# Patient Record
Sex: Female | Born: 1935 | Race: White | Hispanic: No | Smoking: Never smoker
Health system: Southern US, Community
[De-identification: ages and names within clinical notes are randomized; demographics above are authoritative.]

## PROBLEM LIST (undated history)

## (undated) DIAGNOSIS — I10 Essential (primary) hypertension: Secondary | ICD-10-CM

## (undated) DIAGNOSIS — R1310 Dysphagia, unspecified: Secondary | ICD-10-CM

## (undated) DIAGNOSIS — K219 Gastro-esophageal reflux disease without esophagitis: Secondary | ICD-10-CM

## (undated) DIAGNOSIS — D649 Anemia, unspecified: Secondary | ICD-10-CM

## (undated) DIAGNOSIS — E785 Hyperlipidemia, unspecified: Secondary | ICD-10-CM

## (undated) DIAGNOSIS — M1990 Unspecified osteoarthritis, unspecified site: Secondary | ICD-10-CM

## (undated) DIAGNOSIS — H348192 Central retinal vein occlusion, unspecified eye, stable: Secondary | ICD-10-CM

## (undated) DIAGNOSIS — F0390 Unspecified dementia without behavioral disturbance: Secondary | ICD-10-CM

## (undated) HISTORY — DX: Hyperlipidemia, unspecified: E78.5

## (undated) HISTORY — DX: Central retinal vein occlusion, unspecified eye, stable: H34.8192

## (undated) HISTORY — DX: Anemia, unspecified: D64.9

## (undated) HISTORY — DX: Gastro-esophageal reflux disease without esophagitis: K21.9

## (undated) HISTORY — DX: Essential (primary) hypertension: I10

## (undated) HISTORY — PX: OTHER SURGICAL HISTORY: SHX169

---

## 1988-06-19 HISTORY — PX: CHOLECYSTECTOMY: SHX55

## 2003-08-22 ENCOUNTER — Emergency Department (HOSPITAL_COMMUNITY): Admission: AD | Admit: 2003-08-22 | Discharge: 2003-08-22 | Payer: Self-pay | Admitting: Family Medicine

## 2004-05-30 ENCOUNTER — Ambulatory Visit: Payer: Self-pay | Admitting: Internal Medicine

## 2004-05-31 ENCOUNTER — Ambulatory Visit: Payer: Self-pay | Admitting: Internal Medicine

## 2004-06-22 ENCOUNTER — Ambulatory Visit: Payer: Self-pay | Admitting: Internal Medicine

## 2004-08-02 ENCOUNTER — Ambulatory Visit: Payer: Self-pay | Admitting: Internal Medicine

## 2005-05-01 ENCOUNTER — Ambulatory Visit: Payer: Self-pay | Admitting: Internal Medicine

## 2005-05-10 ENCOUNTER — Ambulatory Visit: Payer: Self-pay | Admitting: Internal Medicine

## 2005-05-31 ENCOUNTER — Ambulatory Visit: Payer: Self-pay | Admitting: Internal Medicine

## 2005-08-07 ENCOUNTER — Ambulatory Visit: Payer: Self-pay | Admitting: Internal Medicine

## 2005-08-24 ENCOUNTER — Encounter: Admission: RE | Admit: 2005-08-24 | Discharge: 2005-08-24 | Payer: Self-pay | Admitting: Internal Medicine

## 2006-02-12 ENCOUNTER — Ambulatory Visit: Payer: Self-pay | Admitting: Internal Medicine

## 2006-06-08 ENCOUNTER — Ambulatory Visit: Payer: Self-pay | Admitting: Internal Medicine

## 2006-06-08 LAB — CONVERTED CEMR LAB
ALT: 15 units/L (ref 0–40)
Creatinine, Ser: 1 mg/dL (ref 0.4–1.2)
Triglyceride fasting, serum: 91 mg/dL (ref 0–149)

## 2006-06-19 HISTORY — PX: COLONOSCOPY: SHX174

## 2006-07-26 ENCOUNTER — Ambulatory Visit: Payer: Self-pay | Admitting: Internal Medicine

## 2006-07-27 ENCOUNTER — Ambulatory Visit: Payer: Self-pay | Admitting: Internal Medicine

## 2006-07-27 LAB — CONVERTED CEMR LAB
ALT: 11 units/L (ref 0–40)
AST: 19 units/L (ref 0–37)
BUN: 16 mg/dL (ref 6–23)
Basophils Absolute: 0.1 10*3/uL (ref 0.0–0.1)
Creatinine, Ser: 0.9 mg/dL (ref 0.4–1.2)
Hemoglobin: 8.2 g/dL — ABNORMAL LOW (ref 12.0–15.0)
Lymphocytes Relative: 16.4 % (ref 12.0–46.0)
Monocytes Relative: 5.7 % (ref 3.0–11.0)
Platelets: 614 10*3/uL — ABNORMAL HIGH (ref 150–400)
Potassium: 4.2 meq/L (ref 3.5–5.1)
RDW: 15.3 % — ABNORMAL HIGH (ref 11.5–14.6)
Rhuematoid fact SerPl-aCnc: <20 intl units/mL — ABNORMAL LOW (ref 0.0–20.0)
Sed Rate: 97 mm/hr — ABNORMAL HIGH (ref 0–25)
TSH: 1.59 microintl units/mL (ref 0.35–5.50)
Total CK: 70 units/L (ref 7–177)
Uric Acid, Serum: 4.7 mg/dL (ref 2.4–7.0)

## 2006-08-01 ENCOUNTER — Ambulatory Visit: Payer: Self-pay | Admitting: Internal Medicine

## 2006-08-01 ENCOUNTER — Encounter: Admission: RE | Admit: 2006-08-01 | Discharge: 2006-08-01 | Payer: Self-pay | Admitting: Internal Medicine

## 2006-08-02 LAB — CONVERTED CEMR LAB
Basophils Absolute: 0.2 10*3/uL — ABNORMAL HIGH (ref 0.0–0.1)
Basophils Relative: 2.1 % — ABNORMAL HIGH (ref 0.0–1.0)
MCHC: 34 g/dL (ref 30.0–36.0)
Monocytes Absolute: 0.4 10*3/uL (ref 0.2–0.7)
Neutro Abs: 4 10*3/uL (ref 1.4–7.7)
Neutrophils Relative %: 55 % (ref 43.0–77.0)
Platelets: 243 10*3/uL (ref 150–400)
RBC: 4.65 M/uL (ref 3.87–5.11)

## 2006-08-06 ENCOUNTER — Ambulatory Visit: Payer: Self-pay | Admitting: Internal Medicine

## 2006-08-06 LAB — CONVERTED CEMR LAB
Basophils Absolute: 0.1 10*3/uL (ref 0.0–0.1)
Basophils Relative: 0.7 % (ref 0.0–1.0)
Eosinophils Absolute: 0.1 10*3/uL (ref 0.0–0.6)
Eosinophils Relative: 0.9 % (ref 0.0–5.0)
HCT: 23.7 % — CL (ref 36.0–46.0)
RBC: 3.29 M/uL — ABNORMAL LOW (ref 3.87–5.11)
RDW: 15.4 % — ABNORMAL HIGH (ref 11.5–14.6)

## 2006-08-14 ENCOUNTER — Ambulatory Visit: Payer: Self-pay | Admitting: Gastroenterology

## 2006-08-14 LAB — CONVERTED CEMR LAB
Hemoglobin: 6.9 g/dL — CL (ref 12.0–15.0)
IgA: 223 mg/dL (ref 68–378)
Monocytes Absolute: 0.5 10*3/uL (ref 0.2–0.7)
Neutrophils Relative %: 77.8 % — ABNORMAL HIGH (ref 43.0–77.0)
Platelets: 633 10*3/uL — ABNORMAL HIGH (ref 150–400)
RBC: 3.06 M/uL — ABNORMAL LOW (ref 3.87–5.11)
Tissue Transglutaminase Ab, IgA: <3 units (ref <5)

## 2006-08-15 ENCOUNTER — Ambulatory Visit: Payer: Self-pay | Admitting: Internal Medicine

## 2006-08-15 ENCOUNTER — Encounter (HOSPITAL_COMMUNITY): Admission: RE | Admit: 2006-08-15 | Discharge: 2006-08-17 | Payer: Self-pay | Admitting: Gastroenterology

## 2006-08-21 ENCOUNTER — Ambulatory Visit: Payer: Self-pay | Admitting: Gastroenterology

## 2006-08-21 LAB — CONVERTED CEMR LAB
Basophils Relative: 1 % (ref 0.0–1.0)
Eosinophils Absolute: 0.1 10*3/uL (ref 0.0–0.6)
Eosinophils Relative: 1.4 % (ref 0.0–5.0)
Hemoglobin: 10.3 g/dL — ABNORMAL LOW (ref 12.0–15.0)
MCV: 75.4 fL — ABNORMAL LOW (ref 78.0–100.0)
Monocytes Absolute: 0.7 10*3/uL (ref 0.2–0.7)
Neutro Abs: 7.7 10*3/uL (ref 1.4–7.7)
Neutrophils Relative %: 73.2 % (ref 43.0–77.0)
Platelets: 696 10*3/uL — ABNORMAL HIGH (ref 150–400)

## 2006-08-24 ENCOUNTER — Ambulatory Visit: Payer: Self-pay | Admitting: Internal Medicine

## 2006-08-28 ENCOUNTER — Ambulatory Visit: Payer: Self-pay | Admitting: Gastroenterology

## 2006-09-04 ENCOUNTER — Ambulatory Visit: Payer: Self-pay | Admitting: Gastroenterology

## 2006-09-20 ENCOUNTER — Ambulatory Visit: Payer: Self-pay | Admitting: Gastroenterology

## 2006-10-03 ENCOUNTER — Ambulatory Visit: Payer: Self-pay | Admitting: Gastroenterology

## 2006-10-03 ENCOUNTER — Encounter (INDEPENDENT_AMBULATORY_CARE_PROVIDER_SITE_OTHER): Payer: Self-pay | Admitting: Specialist

## 2006-10-11 ENCOUNTER — Ambulatory Visit: Payer: Self-pay | Admitting: Internal Medicine

## 2006-10-23 ENCOUNTER — Ambulatory Visit: Payer: Self-pay | Admitting: Gastroenterology

## 2006-10-23 LAB — CONVERTED CEMR LAB: Tissue Transglutaminase Ab, IgA: <3 units (ref <5)

## 2006-12-03 ENCOUNTER — Ambulatory Visit: Payer: Self-pay | Admitting: Internal Medicine

## 2007-01-09 ENCOUNTER — Ambulatory Visit: Payer: Self-pay | Admitting: Internal Medicine

## 2007-01-09 LAB — CONVERTED CEMR LAB
Direct LDL: 138.1 mg/dL
VLDL: 24 mg/dL (ref 0–40)

## 2007-01-30 ENCOUNTER — Ambulatory Visit: Payer: Self-pay | Admitting: Internal Medicine

## 2007-02-08 ENCOUNTER — Ambulatory Visit: Payer: Self-pay | Admitting: Endocrinology

## 2007-02-11 ENCOUNTER — Emergency Department (HOSPITAL_COMMUNITY): Admission: EM | Admit: 2007-02-11 | Discharge: 2007-02-11 | Payer: Self-pay | Admitting: Emergency Medicine

## 2007-02-15 ENCOUNTER — Ambulatory Visit: Payer: Self-pay | Admitting: Internal Medicine

## 2007-02-22 ENCOUNTER — Encounter: Payer: Self-pay | Admitting: Internal Medicine

## 2007-02-22 ENCOUNTER — Ambulatory Visit (HOSPITAL_COMMUNITY): Admission: RE | Admit: 2007-02-22 | Discharge: 2007-02-22 | Payer: Self-pay | Admitting: Internal Medicine

## 2007-03-20 ENCOUNTER — Ambulatory Visit: Payer: Self-pay | Admitting: Internal Medicine

## 2007-03-27 ENCOUNTER — Ambulatory Visit: Payer: Self-pay | Admitting: Internal Medicine

## 2007-03-27 LAB — CONVERTED CEMR LAB
Eosinophils Absolute: 0 10*3/uL (ref 0.0–0.6)
Eosinophils Relative: 0.2 % (ref 0.0–5.0)
Iron: 26 ug/dL — ABNORMAL LOW (ref 42–145)
MCHC: 33.7 g/dL (ref 30.0–36.0)
MCV: 87.7 fL (ref 78.0–100.0)
Monocytes Absolute: 0.6 10*3/uL (ref 0.2–0.7)
Monocytes Relative: 4.4 % (ref 3.0–11.0)
Neutrophils Relative %: 79 % — ABNORMAL HIGH (ref 43.0–77.0)
Platelets: 491 10*3/uL — ABNORMAL HIGH (ref 150–400)
RBC: 4.09 M/uL (ref 3.87–5.11)
RDW: 16 % — ABNORMAL HIGH (ref 11.5–14.6)

## 2007-04-06 ENCOUNTER — Encounter: Payer: Self-pay | Admitting: Internal Medicine

## 2007-04-06 DIAGNOSIS — M810 Age-related osteoporosis without current pathological fracture: Secondary | ICD-10-CM

## 2007-04-06 DIAGNOSIS — Z87898 Personal history of other specified conditions: Secondary | ICD-10-CM

## 2007-04-06 DIAGNOSIS — I10 Essential (primary) hypertension: Secondary | ICD-10-CM

## 2007-04-06 DIAGNOSIS — E785 Hyperlipidemia, unspecified: Secondary | ICD-10-CM

## 2007-04-06 DIAGNOSIS — K219 Gastro-esophageal reflux disease without esophagitis: Secondary | ICD-10-CM

## 2007-04-15 ENCOUNTER — Encounter: Payer: Self-pay | Admitting: Internal Medicine

## 2007-04-15 ENCOUNTER — Encounter: Admission: RE | Admit: 2007-04-15 | Discharge: 2007-04-15 | Payer: Self-pay | Admitting: Specialist

## 2007-04-17 ENCOUNTER — Ambulatory Visit (HOSPITAL_COMMUNITY): Admission: RE | Admit: 2007-04-17 | Discharge: 2007-04-17 | Payer: Self-pay | Admitting: Radiology

## 2007-04-24 ENCOUNTER — Ambulatory Visit: Payer: Self-pay | Admitting: Internal Medicine

## 2007-04-29 ENCOUNTER — Encounter: Admission: RE | Admit: 2007-04-29 | Discharge: 2007-04-29 | Payer: Self-pay | Admitting: Specialist

## 2007-06-05 ENCOUNTER — Encounter: Payer: Self-pay | Admitting: Internal Medicine

## 2007-06-11 ENCOUNTER — Ambulatory Visit: Payer: Self-pay | Admitting: Internal Medicine

## 2007-06-25 ENCOUNTER — Telehealth: Payer: Self-pay | Admitting: Internal Medicine

## 2007-07-03 ENCOUNTER — Telehealth (INDEPENDENT_AMBULATORY_CARE_PROVIDER_SITE_OTHER): Payer: Self-pay | Admitting: *Deleted

## 2007-07-05 ENCOUNTER — Ambulatory Visit: Payer: Self-pay | Admitting: Internal Medicine

## 2007-07-05 DIAGNOSIS — M5412 Radiculopathy, cervical region: Secondary | ICD-10-CM | POA: Insufficient documentation

## 2007-07-05 DIAGNOSIS — D510 Vitamin B12 deficiency anemia due to intrinsic factor deficiency: Secondary | ICD-10-CM

## 2007-08-07 ENCOUNTER — Ambulatory Visit: Payer: Self-pay | Admitting: Internal Medicine

## 2007-08-12 ENCOUNTER — Encounter (INDEPENDENT_AMBULATORY_CARE_PROVIDER_SITE_OTHER): Payer: Self-pay | Admitting: *Deleted

## 2007-08-18 ENCOUNTER — Encounter: Payer: Self-pay | Admitting: Internal Medicine

## 2007-08-18 DIAGNOSIS — S3210XA Unspecified fracture of sacrum, initial encounter for closed fracture: Secondary | ICD-10-CM | POA: Insufficient documentation

## 2007-08-18 DIAGNOSIS — S343XXA Injury of cauda equina, initial encounter: Secondary | ICD-10-CM

## 2007-08-18 DIAGNOSIS — S322XXA Fracture of coccyx, initial encounter for closed fracture: Secondary | ICD-10-CM

## 2007-08-29 ENCOUNTER — Ambulatory Visit: Payer: Self-pay | Admitting: Internal Medicine

## 2007-09-04 ENCOUNTER — Ambulatory Visit: Payer: Self-pay | Admitting: Internal Medicine

## 2007-09-11 LAB — CONVERTED CEMR LAB
Magnesium: 2.1 mg/dL (ref 1.5–2.5)
Phosphorus: 3.1 mg/dL (ref 2.3–4.6)

## 2007-10-02 ENCOUNTER — Ambulatory Visit: Payer: Self-pay | Admitting: Internal Medicine

## 2007-10-04 ENCOUNTER — Telehealth: Payer: Self-pay | Admitting: Internal Medicine

## 2007-10-11 ENCOUNTER — Telehealth (INDEPENDENT_AMBULATORY_CARE_PROVIDER_SITE_OTHER): Payer: Self-pay | Admitting: *Deleted

## 2007-10-25 ENCOUNTER — Ambulatory Visit (HOSPITAL_COMMUNITY): Admission: RE | Admit: 2007-10-25 | Discharge: 2007-10-25 | Payer: Self-pay | Admitting: Family Medicine

## 2007-11-08 ENCOUNTER — Ambulatory Visit: Payer: Self-pay | Admitting: Internal Medicine

## 2007-11-12 LAB — CONVERTED CEMR LAB: Vit D, 1,25-Dihydroxy: 15 — ABNORMAL LOW (ref 30–89)

## 2007-11-13 ENCOUNTER — Encounter (INDEPENDENT_AMBULATORY_CARE_PROVIDER_SITE_OTHER): Payer: Self-pay | Admitting: *Deleted

## 2007-11-15 ENCOUNTER — Telehealth (INDEPENDENT_AMBULATORY_CARE_PROVIDER_SITE_OTHER): Payer: Self-pay | Admitting: *Deleted

## 2007-12-09 ENCOUNTER — Ambulatory Visit: Payer: Self-pay | Admitting: Internal Medicine

## 2007-12-12 ENCOUNTER — Ambulatory Visit: Payer: Self-pay | Admitting: Internal Medicine

## 2007-12-19 ENCOUNTER — Encounter (INDEPENDENT_AMBULATORY_CARE_PROVIDER_SITE_OTHER): Payer: Self-pay | Admitting: *Deleted

## 2007-12-19 LAB — CONVERTED CEMR LAB
Rhuematoid fact SerPl-aCnc: <20 intl units/mL — ABNORMAL LOW (ref 0.0–20.0)
Sed Rate: 43 mm/hr — ABNORMAL HIGH (ref 0–22)

## 2007-12-30 ENCOUNTER — Ambulatory Visit: Payer: Self-pay | Admitting: Internal Medicine

## 2007-12-30 DIAGNOSIS — M2550 Pain in unspecified joint: Secondary | ICD-10-CM | POA: Insufficient documentation

## 2008-01-02 ENCOUNTER — Ambulatory Visit: Payer: Self-pay | Admitting: Internal Medicine

## 2008-01-02 ENCOUNTER — Telehealth (INDEPENDENT_AMBULATORY_CARE_PROVIDER_SITE_OTHER): Payer: Self-pay | Admitting: *Deleted

## 2008-01-30 ENCOUNTER — Ambulatory Visit: Payer: Self-pay | Admitting: Internal Medicine

## 2008-02-10 LAB — CONVERTED CEMR LAB
AST: 19 units/L (ref 0–37)
Albumin: 3.2 g/dL — ABNORMAL LOW (ref 3.5–5.2)
Alkaline Phosphatase: 45 units/L (ref 39–117)
Basophils Absolute: 0 10*3/uL (ref 0.0–0.1)
Basophils Relative: 0 % (ref 0.0–3.0)
CO2: 31 meq/L (ref 19–32)
HDL: 45.2 mg/dL (ref >39.0)
MCV: 87.2 fL (ref 78.0–100.0)
Monocytes Absolute: 0.6 10*3/uL (ref 0.1–1.0)
Monocytes Relative: 5.3 % (ref 3.0–12.0)
Neutro Abs: 8.7 10*3/uL — ABNORMAL HIGH (ref 1.4–7.7)
Potassium: 4.4 meq/L (ref 3.5–5.1)
Sodium: 139 meq/L (ref 135–145)
TSH: 2.28 microintl units/mL (ref 0.35–5.50)
Total Bilirubin: 0.8 mg/dL (ref 0.3–1.2)
Total CHOL/HDL Ratio: 4
Total Protein: 6.5 g/dL (ref 6.0–8.3)
Triglycerides: 89 mg/dL (ref 0–149)
VLDL: 18 mg/dL (ref 0–40)
Vit D, 1,25-Dihydroxy: 57 (ref 30–89)
Vitamin B-12: 477 pg/mL (ref 211–911)

## 2008-02-26 ENCOUNTER — Ambulatory Visit: Payer: Self-pay | Admitting: Internal Medicine

## 2008-02-26 ENCOUNTER — Encounter (INDEPENDENT_AMBULATORY_CARE_PROVIDER_SITE_OTHER): Payer: Self-pay | Admitting: *Deleted

## 2008-02-26 LAB — CONVERTED CEMR LAB: OCCULT 1: NEGATIVE

## 2008-02-27 ENCOUNTER — Ambulatory Visit: Payer: Self-pay | Admitting: Internal Medicine

## 2008-03-17 ENCOUNTER — Encounter: Payer: Self-pay | Admitting: Internal Medicine

## 2008-03-26 ENCOUNTER — Ambulatory Visit: Payer: Self-pay | Admitting: Internal Medicine

## 2008-03-26 DIAGNOSIS — F329 Major depressive disorder, single episode, unspecified: Secondary | ICD-10-CM

## 2008-07-20 ENCOUNTER — Telehealth (INDEPENDENT_AMBULATORY_CARE_PROVIDER_SITE_OTHER): Payer: Self-pay | Admitting: *Deleted

## 2008-09-15 ENCOUNTER — Ambulatory Visit: Payer: Self-pay | Admitting: Internal Medicine

## 2008-09-20 LAB — CONVERTED CEMR LAB
Lymphocytes Relative: 71.1 % — ABNORMAL HIGH (ref 12.0–46.0)
Monocytes Absolute: 0.5 10*3/uL (ref 0.1–1.0)
Neutro Abs: 0.4 10*3/uL — ABNORMAL LOW (ref 1.4–7.7)
RBC: 3.79 M/uL — ABNORMAL LOW (ref 3.87–5.11)
RDW: 13.4 % (ref 11.5–14.6)
Vitamin B-12: 409 pg/mL (ref 211–911)

## 2008-09-22 ENCOUNTER — Telehealth (INDEPENDENT_AMBULATORY_CARE_PROVIDER_SITE_OTHER): Payer: Self-pay | Admitting: *Deleted

## 2008-09-23 ENCOUNTER — Encounter (INDEPENDENT_AMBULATORY_CARE_PROVIDER_SITE_OTHER): Payer: Self-pay | Admitting: *Deleted

## 2008-10-01 ENCOUNTER — Ambulatory Visit: Payer: Self-pay | Admitting: Internal Medicine

## 2008-10-01 DIAGNOSIS — D7289 Other specified disorders of white blood cells: Secondary | ICD-10-CM

## 2008-10-01 DIAGNOSIS — R350 Frequency of micturition: Secondary | ICD-10-CM | POA: Insufficient documentation

## 2008-10-01 LAB — CONVERTED CEMR LAB
Glucose, Urine, Semiquant: NEGATIVE
Ketones, urine, test strip: NEGATIVE
Protein, U semiquant: NEGATIVE
Urobilinogen, UA: 0.2

## 2008-10-05 ENCOUNTER — Encounter (INDEPENDENT_AMBULATORY_CARE_PROVIDER_SITE_OTHER): Payer: Self-pay | Admitting: *Deleted

## 2008-10-06 ENCOUNTER — Telehealth (INDEPENDENT_AMBULATORY_CARE_PROVIDER_SITE_OTHER): Payer: Self-pay | Admitting: *Deleted

## 2008-10-19 IMAGING — CR DG LUMBAR SPINE COMPLETE 4+V
5 series · 5 of 5 positions shown · non-contrast
Comparison: none

CLINICAL DATA: Back pain.  Leg pain.  Swelling of the legs.  Hypertension.  
 LUMBAR SPINE - 4 VIEW:

[t l-spine a.p.]
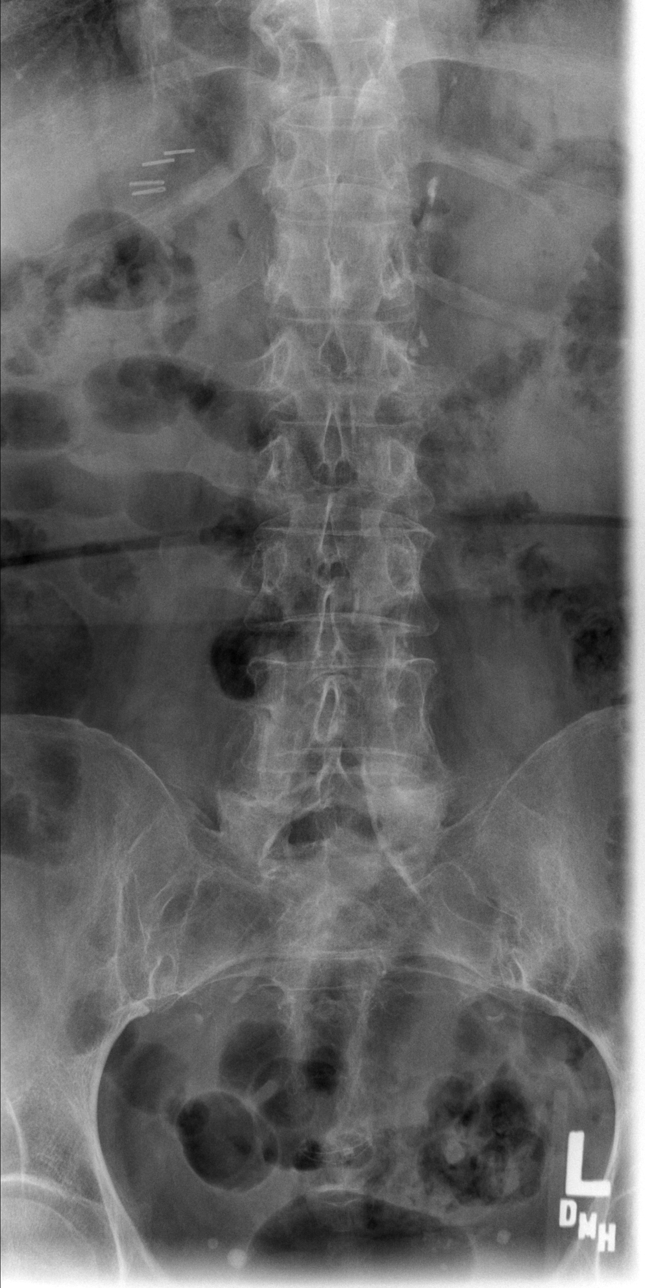

[t l-spine oblique exposure (1 of 2)]
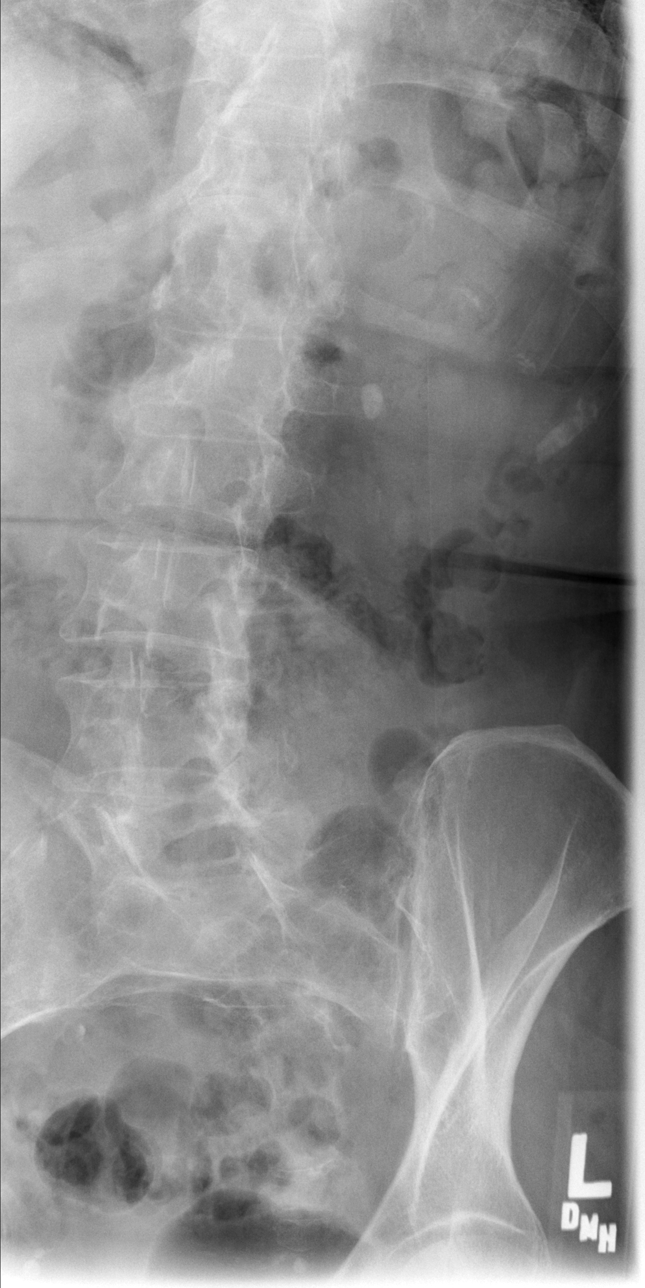

[t l-spine oblique exposure (2 of 2)]
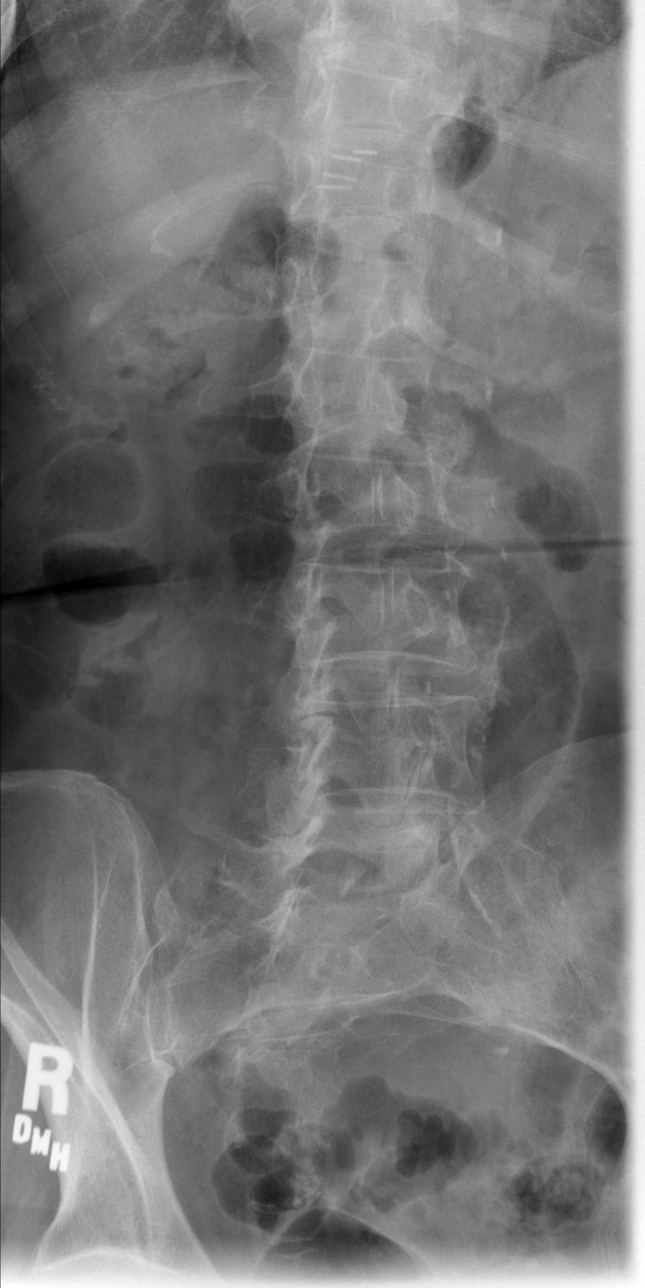

[t l-spine lat]
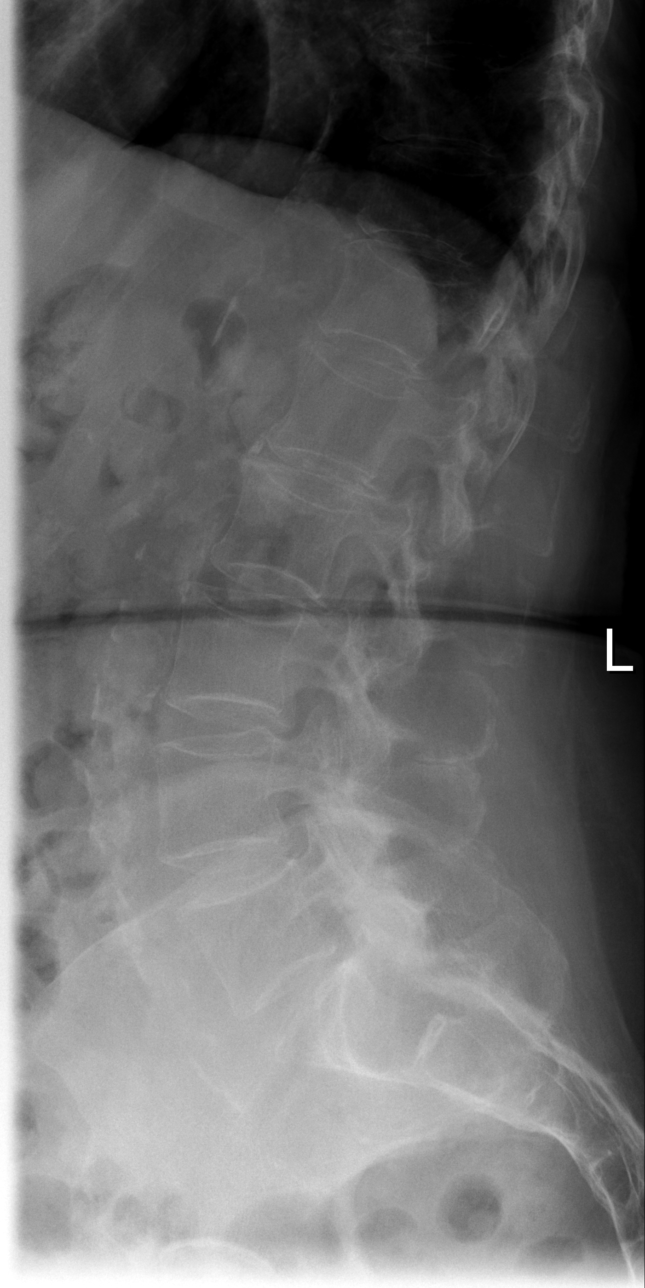

[t l-spine l5-s1 spot]
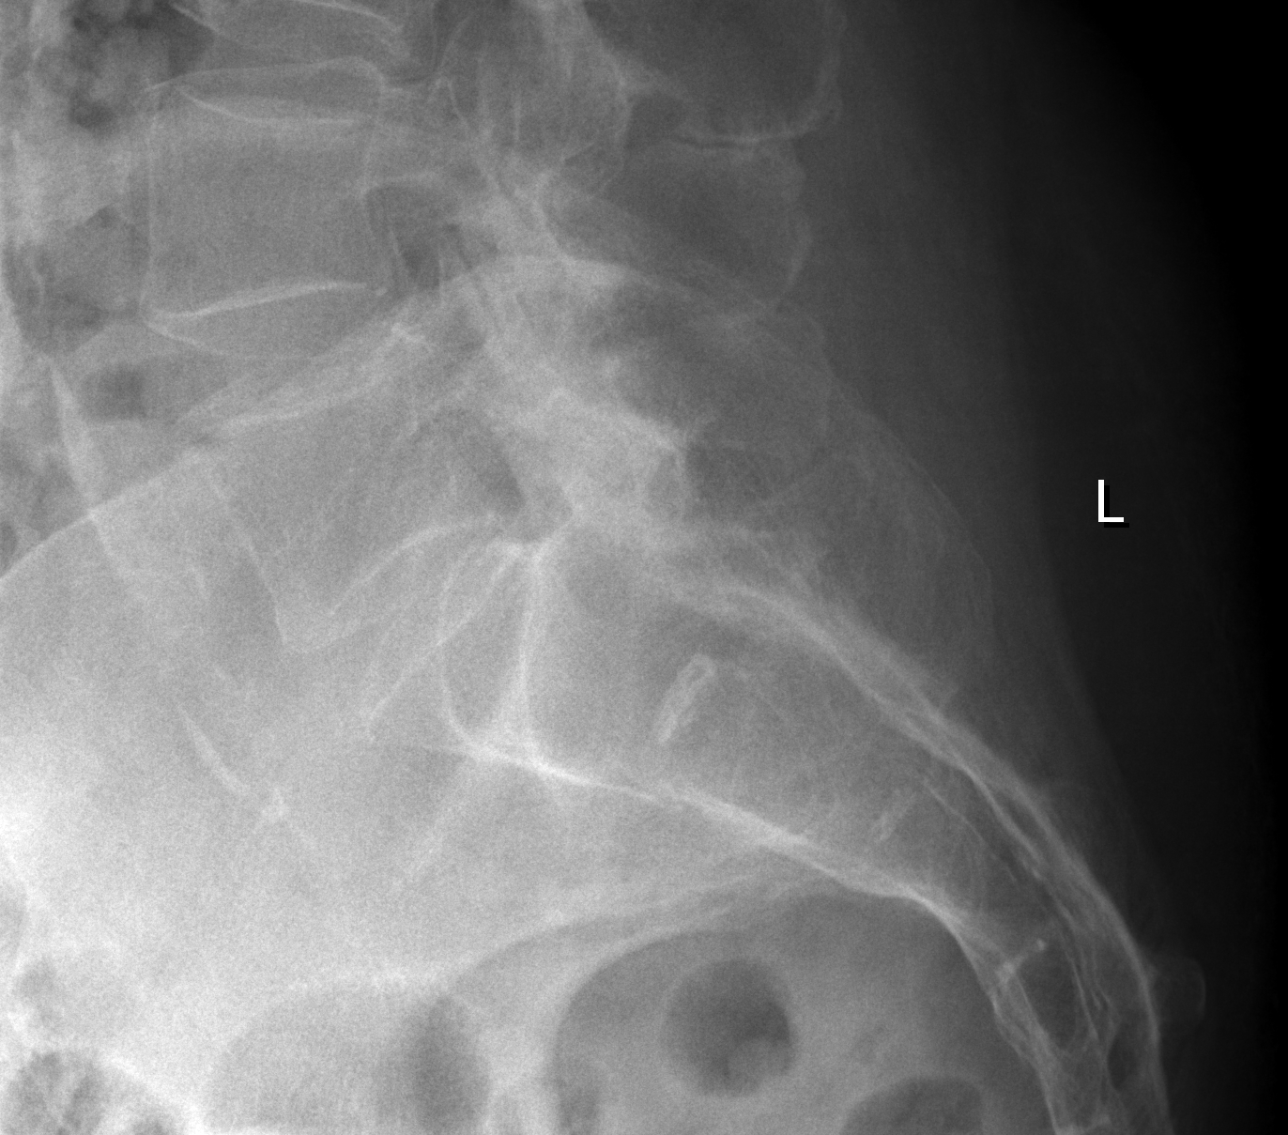

[5 of 5 positions shown; findings below may reference images not displayed]

FINDINGS: Alignment is normal.  No evidence of fracture.  No significant disk space narrowing. Ordinary mild degenerative disease affects the lower lumbar facets.
IMPRESSION: No significant findings.   Mild lower lumbar facet degenerative disease. 
 ACUTE ABDOMINAL SERIES: 
 ABDOMEN - 2 VIEWS:
 Supine and upright views show an unremarkable bowel gas pattern without evidence of ileus, obstruction, or free air.  There is atherosclerosis of the aorta but no discernible aneurysm.  Clips in the right upper quadrant are present post-cholecystectomy.  No focal bony lesion.  
 CHEST - 1 VIEW: 
 Heart size is normal.  There is a large hiatal hernia.   There is mild scarring at the lung bases.  No free air.
IMPRESSION: 1.  Large hiatal hernia.  
 2.  Mild scarring at the lung bases. 
 3.  No abdominal pathology evident.

## 2008-11-27 ENCOUNTER — Telehealth (INDEPENDENT_AMBULATORY_CARE_PROVIDER_SITE_OTHER): Payer: Self-pay | Admitting: *Deleted

## 2008-12-22 ENCOUNTER — Ambulatory Visit: Payer: Self-pay | Admitting: Internal Medicine

## 2008-12-26 LAB — CONVERTED CEMR LAB
HCT: 38.3 % (ref 36.0–46.0)
Hemoglobin: 13.4 g/dL (ref 12.0–15.0)
RBC: 4 M/uL (ref 3.87–5.11)
RDW: 12.2 % (ref 11.5–14.6)
WBC: 4.5 10*3/uL (ref 4.5–10.5)

## 2008-12-29 ENCOUNTER — Encounter (INDEPENDENT_AMBULATORY_CARE_PROVIDER_SITE_OTHER): Payer: Self-pay | Admitting: *Deleted

## 2009-02-01 ENCOUNTER — Ambulatory Visit: Payer: Self-pay | Admitting: Internal Medicine

## 2009-02-01 DIAGNOSIS — R42 Dizziness and giddiness: Secondary | ICD-10-CM | POA: Insufficient documentation

## 2009-02-01 LAB — CONVERTED CEMR LAB: Hemoglobin: 13.5 g/dL

## 2009-06-15 ENCOUNTER — Telehealth (INDEPENDENT_AMBULATORY_CARE_PROVIDER_SITE_OTHER): Payer: Self-pay | Admitting: *Deleted

## 2009-09-15 ENCOUNTER — Telehealth (INDEPENDENT_AMBULATORY_CARE_PROVIDER_SITE_OTHER): Payer: Self-pay | Admitting: *Deleted

## 2009-09-20 ENCOUNTER — Telehealth (INDEPENDENT_AMBULATORY_CARE_PROVIDER_SITE_OTHER): Payer: Self-pay | Admitting: *Deleted

## 2009-11-16 ENCOUNTER — Ambulatory Visit: Payer: Self-pay | Admitting: Internal Medicine

## 2009-11-16 DIAGNOSIS — R5383 Other fatigue: Secondary | ICD-10-CM

## 2009-11-16 DIAGNOSIS — R5381 Other malaise: Secondary | ICD-10-CM | POA: Insufficient documentation

## 2009-11-16 LAB — CONVERTED CEMR LAB
Cholesterol, target level: 200 mg/dL
HDL goal, serum: 40 mg/dL

## 2009-11-23 LAB — CONVERTED CEMR LAB
ALT: 14 units/L (ref 0–35)
AST: 26 units/L (ref 0–37)
Albumin: 4.2 g/dL (ref 3.5–5.2)
Alkaline Phosphatase: 49 units/L (ref 39–117)
Basophils Absolute: 0 10*3/uL (ref 0.0–0.1)
Basophils Relative: 0.2 % (ref 0.0–3.0)
CO2: 32 meq/L (ref 19–32)
Calcium: 9.9 mg/dL (ref 8.4–10.5)
Creatinine, Ser: 1 mg/dL (ref 0.4–1.2)
Eosinophils Absolute: 0.1 10*3/uL (ref 0.0–0.7)
Eosinophils Relative: 1.1 % (ref 0.0–5.0)
GFR calc non Af Amer: 58.98 mL/min (ref >60)
Glucose, Bld: 91 mg/dL (ref 70–99)
HCT: 36.7 % (ref 36.0–46.0)
Hemoglobin: 12.7 g/dL (ref 12.0–15.0)
Lymphocytes Relative: 73.9 % — ABNORMAL HIGH (ref 12.0–46.0)
MCHC: 34.5 g/dL (ref 30.0–36.0)
MCV: 98.8 fL (ref 78.0–100.0)
Neutro Abs: 0.6 10*3/uL — ABNORMAL LOW (ref 1.4–7.7)
Neutrophils Relative %: 12.8 % — ABNORMAL LOW (ref 43.0–77.0)
TSH: 1.46 microintl units/mL (ref 0.35–5.50)
Total CHOL/HDL Ratio: 4
WBC: 4.9 10*3/uL (ref 4.5–10.5)

## 2010-03-18 ENCOUNTER — Telehealth (INDEPENDENT_AMBULATORY_CARE_PROVIDER_SITE_OTHER): Payer: Self-pay | Admitting: *Deleted

## 2010-04-13 ENCOUNTER — Telehealth (INDEPENDENT_AMBULATORY_CARE_PROVIDER_SITE_OTHER): Payer: Self-pay | Admitting: *Deleted

## 2010-04-14 ENCOUNTER — Ambulatory Visit: Payer: Self-pay | Admitting: Internal Medicine

## 2010-04-18 LAB — CONVERTED CEMR LAB
Basophils Absolute: 0.1 10*3/uL (ref 0.0–0.1)
Basophils Relative: 0.9 % (ref 0.0–3.0)
Eosinophils Absolute: 0.1 10*3/uL (ref 0.0–0.7)
Eosinophils Relative: 0.8 % (ref 0.0–5.0)
HCT: 35.6 % — ABNORMAL LOW (ref 36.0–46.0)
Lymphs Abs: 1.9 10*3/uL (ref 0.7–4.0)
Monocytes Absolute: 0.3 10*3/uL (ref 0.1–1.0)
Monocytes Relative: 5.4 % (ref 3.0–12.0)
Neutro Abs: 4.1 10*3/uL (ref 1.4–7.7)
Neutrophils Relative %: 63.5 % (ref 43.0–77.0)
Platelets: 293 10*3/uL (ref 150.0–400.0)

## 2010-04-28 ENCOUNTER — Telehealth: Payer: Self-pay | Admitting: Internal Medicine

## 2010-05-09 ENCOUNTER — Ambulatory Visit: Payer: Self-pay | Admitting: Internal Medicine

## 2010-05-09 DIAGNOSIS — E559 Vitamin D deficiency, unspecified: Secondary | ICD-10-CM | POA: Insufficient documentation

## 2010-05-09 DIAGNOSIS — G2581 Restless legs syndrome: Secondary | ICD-10-CM

## 2010-05-09 DIAGNOSIS — D649 Anemia, unspecified: Secondary | ICD-10-CM

## 2010-05-10 LAB — CONVERTED CEMR LAB
Iron: 113 ug/dL (ref 42–145)
Saturation Ratios: 23.9 % (ref 20.0–50.0)

## 2010-07-19 NOTE — Progress Notes (Signed)
Summary: Refill Request  Phone Note Refill Request Call back at [phone] Message from:  Pharmacy on March 18, 2010 8:16 AM  Refills Requested: Medication #1:  CRESTOR 20 MG TABS take one tablet daily   Dosage confirmed as above?Dosage Confirmed   Supply Requested: 1 month   Last Refilled: 02/17/2009   Notes[facility] [address]  Next Appointment Scheduled: none Initial call taken by: Harold Barban,  March 18, 2010 8:16 AM    Prescriptions: CRESTOR 20 MG TABS (ROSUVASTATIN CALCIUM) take one tablet daily  #30 x 5   Entered by:   Shonna Chock CMA   Authorized by:   Marga Melnick MD   Signed by:   Shonna Chock CMA on 03/18/2010   Method used:   [facility]ically to        [facility] [address] [phone]* (retail)       [address]       [place], [place]  [address]       Ph: [phone] or [phone]       Fax: [phone]   RxID:   [id number]

## 2010-07-19 NOTE — Progress Notes (Signed)
Summary: REFILL  Phone Note Refill Request Message from:  Fax from Pharmacy on September 20, 2009 9:32 AM  Refills Requested: Medication #1:  VITAMIN D 91478 UNIT  CAPS 1 pill weekly [facility] [address] Valinda Hoar [phone]   Method Requested: Fax to Local Pharmacy Next Appointment Scheduled: NO APPT Initial call taken by: Barb Merino,  September 20, 2009 9:33 AM    New/Updated Medications: VITAMIN D 08657 UNIT  CAPS (ERGOCALCIFEROL) 1 pill weekly, **APPOINTMENT DUE** Prescriptions: VITAMIN D 84696 UNIT  CAPS (ERGOCALCIFEROL) 1 pill weekly, **APPOINTMENT DUE**  #4 x 0   Entered by:   Shonna Chock   Authorized by:   Marga Melnick MD   Signed by:   Shonna Chock on 09/20/2009   Method used:   Electronically to        [facility] [address] [phone]* (retail)       [address]       [place], [place]  [address]       Ph: [phone] or [phone]       Fax: [phone]   RxID:   [id number]

## 2010-07-19 NOTE — Progress Notes (Signed)
Summary: ? about lab  Phone Note Call from Patient Call back at [phone] daughter   Summary of Call: patient daughter Aggie Cosier - wanted to know if patient was to have lab drawn - per note on lab [phone] patient was to have  cbcd in 6-8 weeks - since patient didnt repeat lab should she have lab now .Marland KitchenOkey Regal Spring  April 13, 2010 2:43 PM   Follow-up for Phone Call        yes. Lucious Groves CMA  April 13, 2010 3:08 PM   lab scheculed at [facility] [phone] .Marland KitchenOkey Regal Spring  April 13, 2010 4:17 PM

## 2010-07-19 NOTE — Progress Notes (Signed)
Summary: Refill Request  Phone Note Refill Request Message from:  [facility] [address] Valinda Hoar #: [phone]  Refills Requested: Medication #1:  VITAMIN D 69629 UNIT  CAPS 1 pill weekly   Dosage confirmed as above?Dosage Confirmed   Supply Requested: 3 months   Last Refilled: 02/17/2009 Next Appointment Scheduled: none Initial call taken by: Harold Barban,  September 15, 2009 8:38 AM    New/Updated Medications: VITAMIN D 52841 UNIT  CAPS (ERGOCALCIFEROL) 1 pill weekly, APPOINTMENT DUE Prescriptions: VITAMIN D 32440 UNIT  CAPS (ERGOCALCIFEROL) 1 pill weekly, APPOINTMENT DUE  #12 x 0   Entered by:   Shonna Chock   Authorized by:   Marga Melnick MD   Signed by:   Shonna Chock on 09/15/2009   Method used:   Electronically to        [facility] [address] [phone]* (retail)       [address]       [place], [place]  [address]       Ph: [phone] or [phone]       Fax: [phone]   RxID:   [id number]

## 2010-07-19 NOTE — Progress Notes (Signed)
Summary: Vitamin D refill  Phone Note Call from Patient   Summary of Call: Patient called the office for Vitamin D refill, she is aware that she needs an appt. I told the patient I would provide one refill of the med,but her daughter needs to call and schedule patient appt.  FYI--The patient cannot get here without her daughter and I could not make appt due to pt did not know her daughters DOB or appt date for me to look her up. Her daughter works for the school and can only talk at certain times, so I made the patient aware to have her daughter contact us to schedule appt. Initial call taken by: Lucious Groves CMA,  April 28, 2010 10:31 AM    Prescriptions: VITAMIN D 16109 UNIT  CAPS (ERGOCALCIFEROL) 1 pill weekly, **APPOINTMENT DUE**  #4 x 0   Entered by:   Lucious Groves CMA   Authorized by:   Marga Melnick MD   Signed by:   Lucious Groves CMA on 04/28/2010   Method used:   Electronically to        [facility] [address] [phone]* (retail)       [address]       [place], [place]  [address]       Ph: [phone] or [phone]       Fax: [phone]   RxID:   [id number]

## 2010-07-19 NOTE — Assessment & Plan Note (Signed)
Summary: ov per md/cbs   Vital Signs:  Patient profile:   77 year old female Weight:      128 pounds BMI:     22.76 Temp:     98.7 degrees F oral Pulse rate:   88 / minute Resp:     15 per minute BP sitting:   118 / 70  (left arm) Cuff size:   large  Vitals Entered By: Shonna Chock CMA (May 09, 2010 10:40 AM) CC: Renew tramadol and vit d rx's    CC:  Renew tramadol and vit d rx's .  History of Present Illness: She is taking Tramadol at bedtime to help Restless Leg Symptoms. Vitamin D level was 65 in 05/11; she remains on 50,000 International Units weekly. Mild anemia documented in 03/2010.  Current Medications (verified): 1)  Benazepril Hcl 10 Mg Tabs (Benazepril Hcl) .Marland Kitchen.. 1 Once Daily 2)  Crestor 20 Mg Tabs (Rosuvastatin Calcium) .... Take One Tablet Daily 3)  Tums 500 Mg  Chew (Calcium Carbonate Antacid) .... By Mouth As Needed 4)  Aleve 220 Mg  Tabs (Naproxen Sodium) .... Two Times A Day By Mouth As Needed 5)  Baby Asa 81mg  6)  Vitamin D 16109 Unit  Caps (Ergocalciferol) .Marland Kitchen.. 1 Pill Weekly, **appointment Due** 7)  Tramadol Hcl 50 Mg  Tabs (Tramadol Hcl) .Marland Kitchen.. 1 -2 Q 6 Hrs As Needed Joint Pain 8)  Amitriptyline Hcl 10 Mg Tabs (Amitriptyline Hcl) .Marland Kitchen.. 1 At Bedtime 9)  Prilosec Otc 20 Mg Tbec (Omeprazole Magnesium) .Marland Kitchen.. 1 By Mouth Once Daily As Needed  Allergies: 1)  ! Cipro  Review of Systems GI:  Denies abdominal pain, bloody stools, and dark tarry stools. Heme:  Denies abnormal bruising and bleeding.  Physical Exam  General:  Appears younger than age,in no acute distress; alert,appropriate and cooperative throughout examination Lungs:  Normal respiratory effort, chest expands symmetrically. Lungs are clear to auscultation, no crackles or wheezes. Heart:  Normal rate and regular rhythm. S1 and S2 normal without gallop, murmur, click, rub . S4 with slurring Abdomen:  Bowel sounds positive,abdomen soft and non-tender without masses, organomegaly or hernias  noted. Pulses:  R and L dorsalis pedis and posterior tibial pulses are full and equal bilaterally Extremities:  No clubbing, cyanosis, edema. Cervical Nodes:  No lymphadenopathy noted Axillary Nodes:  No palpable lymphadenopathy   Impression & Recommendations:  Problem # 1:  RESTLESS LEG SYNDROME (ICD-333.94)  Orders: Venipuncture (60454) TLB-IBC Pnl (Iron/FE;Transferrin) (83550-IBC) Prescription Created Electronically [phone]  Problem # 2:  ANEMIA, MILD (ICD-285.9)  Orders: Venipuncture (91478) TLB-B12 + Folate Pnl (29562_13086-V78/ION) TLB-IBC Pnl (Iron/FE;Transferrin) (83550-IBC)  Problem # 3:  VITAMIN D DEFICIENCY (ICD-268.9)  Orders: T-Vitamin D (25-Hydroxy) (62952-84132)  Complete Medication List: 1)  Benazepril Hcl 10 Mg Tabs (benazepril Hcl)  .Marland Kitchen.. 1 once daily 2)  Crestor 20 Mg Tabs (Rosuvastatin calcium) .... Take one tablet daily 3)  Tums 500 Mg Chew (Calcium carbonate antacid) .... By mouth as needed 4)  Aleve 220 Mg Tabs (Naproxen sodium) .... Two times a day by mouth as needed 5)  Baby Asa 81mg   6)  Vitamin D 44010 Unit Caps (Ergocalciferol) .Marland Kitchen.. 1 pill weekly 7)  Tramadol Hcl 50 Mg Tabs (Tramadol hcl) .Marland Kitchen.. 1 -2 q 6 hrs as needed joint pain 8)  Amitriptyline Hcl 10 Mg Tabs (Amitriptyline hcl) .Marland Kitchen.. 1 at bedtime 9)  Prilosec Otc 20 Mg Tbec (Omeprazole magnesium) .Marland Kitchen.. 1 by mouth once daily as needed  Patient Instructions: 1)  Vitamin D dose  will be determined based on results today. 2)  Please schedule a follow-up appointment in 6 months. 3)  BMP prior to visit, ICD-9:401.9 4)  Hepatic Panel prior to visit, ICD-9:995.20 5)  Lipid Panel prior to visit, ICD-9:272.4 6)  TSH prior to visit, ICD-9:272.4 7)  CBC w/ Diff prior to visit, ICD-9:285.9 Prescriptions: TRAMADOL HCL 50 MG  TABS (TRAMADOL HCL) 1 -2 q 6 hrs as needed joint pain  #90 x 1   Entered and Authorized by:   Marga Melnick MD   Signed by:   Marga Melnick MD on 05/09/2010   Method used:   Faxed  to ...       [facility] [address] [phone]* (retail)       [address]       [place], [place]  [address]       Ph: [phone] or [phone]       Fax: [phone]   RxID:   [id number]    Orders Added: 1)  Est. Patient Level III [16967] 2)  Venipuncture [89381] 3)  TLB-B12 + Folate Pnl [82746_82607-B12/FOL] 4)  TLB-IBC Pnl (Iron/FE;Transferrin) [83550-IBC] 5)  T-Vitamin D (25-Hydroxy) [01751-02585] 6)  Prescription Created Electronically [phone]  Appended Document: ov per md/cbs

## 2010-07-19 NOTE — Assessment & Plan Note (Signed)
Summary: med refill /cbs   Vital Signs:  Patient profile:   77 year old female Height:      63 inches Weight:      127 pounds BMI:     22.58 Temp:     98.2 degrees F oral Pulse rate:   60 / minute Resp:     14 per minute BP sitting:   114 / 68  (left arm) Cuff size:   large  Vitals Entered By: Kara Burnett (Nov 16, 2009 2:22 PM) CC: Yearly follow-up on meds , Hypertension Management, Lipid Management Comments REVIEWED MED LIST, PATIENT AGREED DOSE AND INSTRUCTION CORRECT    CC:  Yearly follow-up on meds , Hypertension Management, and Lipid Management.  History of Present Illness: Kara Burnett is here for med refills; she has no active concern except intermittent weakness. No regular CVE, some yardwork. No specific diet.  Hypertension History:      She denies headache, chest pain, palpitations, dyspnea with exertion, orthopnea, PND, peripheral edema, visual symptoms, neurologic problems, syncope, and side effects from treatment.  BP well controlled @ home .        Positive major cardiovascular risk factors include female age 15 years old or older, hyperlipidemia, hypertension, and family history for ischemic heart disease (females less than 66 years old).  Negative major cardiovascular risk factors include no history of diabetes and non-tobacco-user status.        Further assessment for target organ damage reveals no history of ASHD, stroke/TIA, or peripheral vascular disease.    Lipid Management History:      Positive NCEP/ATP III risk factors include female age 39 years old or older, family history for ischemic heart disease (females less than 35 years old), and hypertension.  Negative NCEP/ATP III risk factors include non-diabetic, non-tobacco-user status, no ASHD (atherosclerotic heart disease), no prior stroke/TIA, no peripheral vascular disease, and no history of aortic aneurysm.      Preventive Screening-Counseling & Management  Alcohol-Tobacco     Smoking Status:  never  Allergies: 1)  ! Cipro  Past History:  Past Medical History: Hx of HERPES ZOSTER, HX OF (ICD-V13.8) HYPERTENSION (ICD-401.9) HYPERLIPIDEMIA (ICD-272.4): [place] LDL goal = < 130 GERD (ICD-530.81) ANEMIA-NOS (ICD-285.9) OSTEOPOROSIS (ICD-733.00) fractured pelvis, S/P   sacroplasty    Past Surgical History: Cholecystectomy (1990) Sacrolplasty for fractured pelvis; G 4 P 4  Colonoscopy negative ? 2005  Family History: Father: MI in 71s, CVA Mother: negative, coma Siblings: sister : DM, MI in ? 41s  Social History: Retired Never Smoked Alcohol use-no  Review of Systems General:  Complains of fatigue; denies weight loss; Weight up 8-10 #. Eyes:  Denies blurring, double vision, and vision loss-both eyes. ENT:  Denies difficulty swallowing, hoarseness, and nosebleeds. CV:  Denies leg cramps with exertion. GI:  Denies abdominal pain, bloody stools, change in bowel habits, constipation, dark tarry stools, diarrhea, and indigestion. MS:  Complains of joint pain and low back pain; denies joint redness and joint swelling; Occasional LBP. Derm:  Denies lesion(s) and rash. Neuro:  Denies brief paralysis, disturbances in coordination, numbness, poor balance, tingling, and weakness. Psych:  Complains of depression, easily angered, easily tearful, and irritability; denies anxiety; She quit Zoloft ; "no spells until recently". Endo:  Complains of cold intolerance; denies excessive hunger, excessive thirst, excessive urination, and heat intolerance. Heme:  Denies abnormal bruising and bleeding.  Physical Exam  General:  Appears younger than age;well-nourished,in no acute distress; alert,appropriate and cooperative throughout examination Ears:  External ear  exam shows no significant lesions or deformities.   Hearing is grossly decreased  bilaterally. Neck:  No deformities, masses, or tenderness noted. Lungs:  Normal respiratory effort, chest expands symmetrically. Lungs are  clear to auscultation, no crackles or wheezes. Heart:  Normal rate and regular rhythm. S1 and S2 normal without gallop, murmur, click, rub . S4 with slurring Abdomen:  Bowel sounds positive,abdomen soft and non-tender without masses, organomegaly or hernias noted. Msk:  Lordotic curvature Pulses:  R and L carotid,radial,dorsalis pedis and posterior tibial pulses are full and equal bilaterally Extremities:  No clubbing, cyanosis, edema, or deformity noted  Neurologic:  alert & oriented X3 and DTRs symmetrical and normal.   Skin:  Intact without suspicious lesions or rashes Cervical Nodes:  No lymphadenopathy noted Axillary Nodes:  No palpable lymphadenopathy Psych:  memory intact for recent and remote, normally interactive, good eye contact, not anxious appearing, and not depressed appearing.   Laughing intermittently   Impression & Recommendations:  Problem # 1:  HYPERTENSION (ICD-401.9)  Controlled   Orders: EKG w/ Interpretation (93000) Venipuncture (16109) TLB-BMP (Basic Metabolic Panel-BMET) (80048-METABOL)  Problem # 2:  HYPERLIPIDEMIA (ICD-272.4)  Her updated medication list for this problem includes:    Crestor 20 Mg Tabs (Rosuvastatin calcium) .Marland Kitchen... Take one tablet daily. Note:  Crestor  taken  very irregularly  Orders: Venipuncture (60454) TLB-Lipid Panel (80061-LIPID) TLB-Hepatic/Liver Function Pnl (80076-HEPATIC)  Problem # 3:  FATIGUE (ICD-780.79)  Orders: Venipuncture (09811) TLB-CBC Platelet - w/Differential (85025-CBCD) TLB-TSH (Thyroid Stimulating Hormone) (84443-TSH)  Problem # 4:  GERD (ICD-530.81)  Her updated medication list for this problem includes:    Tums 500 Mg Chew (Calcium carbonate antacid) ..... By mouth as needed  Orders: Venipuncture (91478)  Problem # 5:  PERNICIOUS ANEMIA (ICD-281.0)  The following medications were removed from the medication list:    Ferrex 150 150 Mg Caps (Polysaccharide iron complex) .Marland Kitchen... 1 by mouth  qd  Orders: Venipuncture (29562) TLB-CBC Platelet - w/Differential (85025-CBCD) TLB-B12, Serum-Total ONLY (13086-V78)  Problem # 6:  DEPRESSIVE DISORDER NOT ELSEWHERE CLASSIFIED (ICD-311)  The following medications were removed from the medication list:    Zoloft 50 Mg Tabs (Sertraline hcl) .Marland Kitchen... Take 1/2 by mouth once daily for 8 days then 1 by mouth once daily Her updated medication list for this problem includes:    Amitriptyline Hcl 10 Mg Tabs (Amitriptyline hcl) .Marland Kitchen... 1 at bedtime  Orders: TLB-TSH (Thyroid Stimulating Hormone) (84443-TSH)  Complete Medication List: 1)  Benazepril Hcl 10 Mg Tabs (benazepril Hcl)  .Marland Kitchen.. 1 once daily 2)  Crestor 20 Mg Tabs (Rosuvastatin calcium) .... Take one tablet daily 3)  Tums 500 Mg Chew (Calcium carbonate antacid) .... By mouth as needed 4)  Aleve 220 Mg Tabs (Naproxen sodium) .... Two times a day by mouth as needed 5)  Baby Asa 81mg   6)  Vitamin D 46962 Unit Caps (Ergocalciferol) .Marland Kitchen.. 1 pill weekly, **appointment due** 7)  Tramadol Hcl 50 Mg Tabs (Tramadol hcl) .Marland Kitchen.. 1 -2 q 6 hrs as needed joint pain 8)  Amitriptyline Hcl 10 Mg Tabs (Amitriptyline hcl) .Marland Kitchen.. 1 at bedtime  Other Orders: T-Vitamin D (25-Hydroxy) [phone])  Hypertension Assessment/Plan:      The patient's hypertensive risk group is category B: At least one risk factor (excluding diabetes) with no target organ damage.  Her calculated 10 year risk of coronary heart disease is 7 %.  Today's blood pressure is 114/68.    Lipid Assessment/Plan:      Based on NCEP/ATP III,  the patient's risk factor category is "2 or more risk factors and a calculated 10 year CAD risk of < 20%".  The patient's lipid goals are as follows: Total cholesterol goal is 200; LDL cholesterol goal is 130; HDL cholesterol goal is 40; Triglyceride goal is 150.  Her LDL cholesterol goal has been met.    Patient Instructions: 1)  Please correct/ update  record. Recommendations will depend on Lab results.  Minimal LDL goal = < 130. Prescriptions: TRAMADOL HCL 50 MG  TABS (TRAMADOL HCL) 1 -2 q 6 hrs as needed joint pain  #30 Tablet x 5   Entered and Authorized by:   Marga Melnick MD   Signed by:   Marga Melnick MD on 11/16/2009   Method used:   Print then Give to Patient   RxID:   [id number] AMITRIPTYLINE HCL 10 MG TABS (AMITRIPTYLINE HCL) 1 at bedtime  #30 x 5   Entered and Authorized by:   Marga Melnick MD   Signed by:   Marga Melnick MD on 11/16/2009   Method used:   Print then Give to Patient   RxID:   [id number] BENAZEPRIL HCL 10 MG TABS (BENAZEPRIL HCL) 1 once daily  #90 x 3   Entered and Authorized by:   Marga Melnick MD   Signed by:   Marga Melnick MD on 11/16/2009   Method used:   Print then Give to Patient   RxID:   [id number]

## 2010-10-12 ENCOUNTER — Other Ambulatory Visit: Payer: Self-pay | Admitting: *Deleted

## 2010-10-12 MED ORDER — AMITRIPTYLINE HCL 10 MG PO TABS: 10.0000 mg | ORAL_TABLET | Freq: Every day | ORAL | Status: DC

## 2010-11-04 NOTE — Assessment & Plan Note (Signed)
[facility]                         GASTROENTEROLOGY OFFICE NOTE   NAME:JOHNSONKellsie, Kara Burnett                     MRN:          [id number]  DATE:09/20/2006                            DOB:          August 23, 1924    This is a return office visit for iron deficiency anemia, B-12  deficiency anemia, and a positive intrinsic factor antibody.  She also  likely has polymyalgia rheumatica and was started on prednisone by Dr.  Alwyn Ren with substantial improvement in her pain and movements in her  back, shoulders, arms, and hands.  She had a blood transfusion as an  outpatient due to her severe anemia with a hemoglobin of 6.9 on February  26 and  the post-transfusion hemoglobin was 10.3 on March 4.  She has no  ongoing gastrointestinal complaints.   Current medications listed on the chart are updated and reviewed.   MEDICATION ALLERGIES:  CIPRO.   PHYSICAL EXAMINATION:  GENERAL:  No acute distress.  VITAL SIGNS:  Weight 144.4 pounds.  Blood pressure 112/72.  Pulse 72 and  regular.  CHEST:  Clear to auscultation bilaterally.  CARDIAC:  Regular rate and rhythm without murmurs.  ABDOMEN:  Soft and nontender with normoactive bowel sounds.   ASSESSMENT/PLAN:  Unexplained iron deficiency anemia.  Rule out occult  gastrointestinal losses.  The risks, benefits, and alternatives to  colonoscopy with possible biopsy and possible polypectomy, and upper  endoscopy with possible biopsy, discussed with the patient and her  daughter and the patient consents to proceed.  This will be scheduled  electively.  She is to continue iron and B-12 replacement.     Venita Lick. Russella Dar, MD, [facility]  Electronically Signed    MTS/MedQ  DD: 09/21/2006  DT: 09/21/2006  Job #: [id number]   cc:   Titus Dubin. Alwyn Ren, MD,FACP,FCCP

## 2010-11-04 NOTE — Letter (Signed)
August 24, 2006    Venita Lick. Russella Dar MD, FACG  [address]  [place], [place] [address]   RE:  MILAGRO, BELMARES  MRN:  [id number]  /  DOB:  18-May-1925   Dear Judie Petit:   Thank you for your assessment and recommendations in Mrs. Buffalo's care  .   Unfortunately, she was not notified of the low B-12 level as I had  planned. I had left for CME on the 20th and priority was given to  getting the CAT scans that I had requested. Unfortunately, my nurse was  also absent during that period of 2/20-2/24.   The B-12 should be addressed, but it is not causing her symptoms which  do suggest a polymyalgia picture. She has a sed rate of 97 and I will  recommend a course of oral steroids despite the fact that she does have  osteoporosis. I think this is indicated as she is miserable with  diffuse pain seemingly greatest @ the lumbosacral area. The low back  pain originally concerned me for spinal stenosis or primary boney  process & an MRI was planned;but the presence of the anemia made a  possible intra-abdominal process more likely.The CAT scans of the  abdomen & pelvis only revealed  vague right perineal posterior  paravaginal soft tissue strand like densities in the right perineum of  undetermined etiology. She has no gynecologic symptoms of pelvic pain  or bleeding, but has had no gynecologic follow up for years. One of her  daughters sees Dr. Huel Cote and I will ask Olegario Messier if she will be  kind enough to do a gynecologic evaluation on [place].   In reviewing the labs, her hematocrit on February 18 was 23.7 and on  2/26 had dropped to 21.6. This suggests an active blood loss despite the  negative hemoccults.Unfortunately her initial followup CBC which was  normal must have been erroneousThe 2/26 values appear to confirm that  suspicion. After transfusions, her hematocrit is 32.1; this will be  monitored for active or ongoing blood loss.   She will continue the B-12 shots weekly for  one month and monthly  thereafter. A B-12 level will be rechecked in August. The value of 184  with normals of over 211 again suggests that this is not a major issue  here. The serum iron levels of 12 with normals over 42 suggest at least  a chronic blood loss which was clinically asymptomatic & which did not  result in change in vital signs.   With her focus on the back pain, I feel any pelvic process must be  evaluated especially in view of the nonspecific CAT scan findings. After  Dr. Olegario Messier Richardson's evaluation, we will continue to monitor her  hematocrit and her response to steroids. If she continues to exhibit  progressive anemia, despite oral iron, we will ask for your  reassessment.    Sincerely,      Titus Dubin. Alwyn Ren, MD,FACP,FCCP  Electronically Signed    [facility]/MedQ  DD: 08/24/2006  DT: 08/25/2006  Job #: [id number]   CC:    Huel Cote, M.D.

## 2010-11-04 NOTE — Assessment & Plan Note (Signed)
[facility]                           PRIMARY CARE OFFICE NOTE   Kara Burnett, Kara Burnett                     MRN:          [id number]  DATE:10/11/2006                            DOB:          1924-11-20    Kara Burnett is a 77, soon to be 77 year old Caucasian woman who presents  to establish for ongoing continuity care, transferring her care from Dr.  Alwyn Ren at [place].   CHIEF COMPLAINT:  1. Anemia. The patient reports that she has recently been diagnosed      with profound anemia with a hemoglobin that reached a low of 6.9      requiring a two unit transfusion of blood. She was found to be B12      deficient and has been started on B12 injections at 1000 mcg      monthly.  2. Polymyalgia rheumatica for which the patient was started on      prednisone. She had poor insight into this diagnosis. She had on      her own reduced her prednisone from 20 mg b.i.d. to 20 mg daily.      She reports that she had an excellent result in regards to relief      of pain and discomfort with prednisone.   PAST MEDICAL HISTORY:   SURGICAL:  Cholecystectomy in 1990.   TRAUMA:  None.   GYNECOLOGIC:  Gravida 4, para 4, with normal spontaneous vaginal  deliveries.   MEDICAL ILLNESSES:  1. Usual childhood diseases.  2. GERD.  3. Hypertension.  4. Hyperlipidemia.  5. Anemia.  6. Episode of zoster in the past.   CURRENT MEDICATIONS:  1. Benazepril 20 mg daily. The patient was on Crestor, but that has      been on hold.  2. Aspirin 81 mg daily.  3. Sertraline 25 mg daily.  4. Darvocet-N 100 every 6 hours p.r.n.  5. Prednisone 20 mg daily.  6. Repliva eye drops daily.  7. Tums daily.   HABITS:  Tobacco none. Alcohol none.   ALLERGIES:  The patient has no known drug allergies.   FAMILY HISTORY:  Mother is deceased at age 61 of unknown causes. Father  deceased at 29 with probable myocardial infarction. The patient has a  brother who had a  childhood traumatic injury and then contracted  meningitis and then succumbed to that. The patient has three sisters,  one of who died secondary to complications related to diabetes, one who  died of cancer, and one who is living. Family history is also positive  for arthritis and diabetes.   SOCIAL HISTORY:  The patient completed the ninth grade. She worked her  career of 32 years plus at [employer] in [place]. She was married  and divorced. She remarried and was divorced again. She has been single  and a single mother. She does not recall the length of her marriages.  She has two sons, two daughters, eight grandchildren, and three great-  grandchildren. The patient has her own home, but for the last year has  been living with her daughter. She otherwise remains very  independent  and now that her strength is returning after her correction of anemia,  she is even more independent.   PHYSICAL EXAMINATION:  VITAL SIGNS:  Temperature 97.7, blood pressure  117/67, pulse 88, weight 150.  GENERAL:  This is a pleasant, well groomed, well nourished woman looking  younger than her stated chronologic age. No further examination was  conducted.   CHART REVIEW:  The patient's last visit to [facility] was  09/20/06 at which time she was thought to be stable. She did undergo  colonoscopy on 10/03/06 which was significant for diverticulosis,  internal hemorrhoids, but no other abnormal findings. EGD was performed  which revealed a hiatal hernia. She had abnormalities in the cardia of  the stomach with erosions and erythematous mucosa, consistent with  Cameron's erosions. In the duodenal bulb, she has erosions and  erythematous mucosa. Biopsies were performed per the report, but they  are not available to me at this time.   The patient did have a hospitalization in November 2002, when she was  operated on by Dr. Marcine Matar, as she had evaluation for left  hydroureteronephrosis.  She also had a 3.5 cm infrarenal abdominal aortic  aneurysm. Cystostomy was performed at that time which was unremarkable  and she underwent retrograde ureteroscopy. This did reveal the patient  to have a tumor-like polyp on the left to mid distal ureter. Breast  biopsy was performed and she had a double J stent placed which was  socially removed at a later date.   LABORATORY REVIEW:  The patient did have labs for celiac disease which  was unremarkable. She did have antibodies to an intrinsic factor. CBC  was last performed on 08/21/06 showing a hemoglobin of 10.3 grams with an  MCV of 75.4, normal differential. The patient did have anemia labs on  July 27, 2006 which revealed the patient to have an iron of 12. B12  was low at 184. Folate was low at 7.4. Peptide screens were negative.  Rheumatoid factor was less than 20 and negative. Chemistries from  03/04/03 were normal, including a normal serum glucose at that time of  99.   RADIOLOGY STUDIES:  She had a CAT scan of the abdomen of pelvis 08/15/06  which revealed a very large hiatal hernia showing that her stomach was  essentially in her chest. A CAT scan of the pelvis revealed minimal left  colon diverticular disease, right perineal posterior paravaginal soft  tissue stranding of unspecified significance.   Bone density studies from 12/06/99 which did reveal that the patient did  have osteoporosis and I note that she is not on any replacement therapy  at this time.   ASSESSMENT AND PLAN:  1. Anemia. The patient with both iron and B12 deficiency anemia with      normal endoscopy studies and colonoscopy studies as noted. The      patient's last hemoglobin was stable. I would defer to Dr. Russella Dar in      this regard, although she may benefit from iron replacement therapy      which we will discuss at her next visit. The patient was given a      B12 shot at today's visit. 2. Polymyalgia rheumatica. The patient was diagnosed with  polymyalgia      rheumatica by Dr. Alwyn Ren and was started on prednisone with a very      gratifying relief and response in regards to the pain and  discomfort which has primarily been in her shoulder girdle, and hip      girdle. In reviewing the patient's laboratory, she did have a      normal CK making a statin reaction very unlikely. She did have a      markedly elevated sedimentation rate at 97. PLAN: The patient has      already decreased her prednisone to 20 mg daily. We will obtain a      sedimentation rate. If the patient has a normal sedimentation rate,      we would then consider continuing to taper down her prednisone,      titrating this to the symptoms and ESR. The patient is provided      with the patient information chapter from up-to-date for her      education and information.  3. Hyperlipidemia. I would recommend that the patient resume her      Crestor given that her CK was normal and that her symptoms are more      consistent with PMR.  4. Osteoporosis. The last study on the chart from 2001 indicates that      the patient does have significant osteoporosis. Given her large      hiatal hernia, I think she would be a poor candidate for      bisphosphonate therapy. I would want to consider this patient for      possible treatment with Evista as GI safe. We will discuss this      further at her next visit.  5. End of life issues. At this visit, I did discuss with the patient      the need to consider issues including CPR, mechanical ventilation,      and other heroic and supportive measures. I stressed the need and      importance of discussing this at times of good health, so that      informed decisions could be made in this regard.   In summary, this is a very delightful patient who is here to establish  for ongoing care. She will return to see me in 4-6 weeks for a follow up  visit and a B12 injection.     Rosalyn Gess Norins, MD  Electronically Signed     MEN/MedQ  DD: 10/11/2006  DT: 10/12/2006  Job #: [id number]   cc:   Shanon Payor

## 2010-11-04 NOTE — Assessment & Plan Note (Signed)
HEALTHCARE                         GASTROENTEROLOGY OFFICE NOTE   NAME:JOHNSONSyenna, Nazir                     MRN:          [id number]  DATE:08/14/2006                            DOB:          1925/02/03    Ms. Ohagan returns today on referral from Dr. Alwyn Ren with her daughter,  Stacie Glaze, complaining of substantial pain and limited movement for  the past several weeks.  She has pain in her lower back, shoulders,  legs, arms, and hands.  She is undergoing evaluation by Dr. Alwyn Ren,  looking for polymyalgia rheumatica, spinal stenosis, and other  disorders.  She was found to have a significant microcytic anemia with a  hemoglobin of 7.6 and an MCV of 72.1.  her iron was low at 12 and  Vitamin B12 low at 184.  Folate level was normal.  TSH normal.  Erythrocyte sedimentation rate was 79.  She underwent colonoscopy and  upper endoscopy in October of 2004 for anemia, weight loss, change in  bowel habits, and reflux symptoms.  She was found to have a 7 cm hiatal  hernia, diverticulosis, and internal hemorrhoids.  She was treated with  Protonix at that time and has since discontinued this medication.  She  has no ongoing reflux symptoms at this time.  She states recent stool  Hemoccults were obtained and were negative.  She has no gastrointestinal  complaints right now and specifically denies abdominal pain, change in  bowel habits, melena, hematochezia, dysphagia, odynophagia, anorexia,  heartburn, or dyspeptic symptoms.  She has a CT scan scheduled for  tomorrow.   CURRENT MEDICATIONS:  Listed on the chart, updated, and reviewed.   MEDICATION ALLERGIES:  None known.   PHYSICAL EXAM:  An elderly white female who appears uncomfortable.  Weight 148 pounds, blood pressure 110/66, pulse 80 and regular.  HEENT:  Anicteric sclerae.  Oropharynx clear.  CHEST:  Clear to auscultation bilaterally.  CARDIAC:  Regular rate and rhythm without murmurs.  ABDOMEN:  Soft and nontender with normoactive bowel sounds.  No palpable  organomegaly, masses, or hernias.   ASSESSMENT AND PLAN:  Iron deficiency and B12 deficiency anemia.  No  active gastrointestinal symptoms.  Recent stool Hemoccults negative.  Colonoscopy and EGD in 10/04 were unremarkable. Will obtain a tissue  transglutaminase, IgA, parietal cell antibodies, intrinsic factor  antibodies and CBC today.  Begin Repliva 1 p.o. daily for the next 2  months.  Begin Vitamin B12 injections with 1000 mcg IM today.  Dr.  Alwyn Ren to further manage her anemia and B12 injections.  She may have  occult gastrointestinal losses from erosions associated with her hiatal  hernia or other gastrointestinal lesions leading to her low iron or she  may have poor iron absorption.  I would like to see her back in about 6  weeks to assess her response to B12 and  iron.  We will reconsider the need to repeat colonoscopy and endoscopy  at that time.  I have advised her to follow up with Dr. Alwyn Ren later  this week.     Venita Lick. Russella Dar, MD, [facility]  Electronically Signed  MTS/MedQ  DD: 08/14/2006  DT: 08/14/2006  Job #: [id number]   cc:   Titus Dubin. Alwyn Ren, MD,FACP,FCCP

## 2010-11-04 NOTE — Assessment & Plan Note (Signed)
[facility]                        [place] OFFICE NOTE   NAME:Kara Burnett, Reznick                     MRN:          [id number]  DATE:07/27/2006                            DOB:          01-08-1925    Kara Burnett was seen on July 27, 2006, complaining of diffuse  aches with profound soreness in the lower back.  Symptoms have been  present since Christmas 2007 with the pain and aching across the  lumbosacral spine worse at night.  She also noted some symptoms in her  arms and shoulder.  She felt that she was having decreased flexion of  her hands.  The symptoms were described as constant.   She has had intermittent anorexia.  She has also had dysphagia on three  occasions with taking the pill Os-Cal.   Past history includes a laparoscopic cholecystectomy, four pregnancies,  four deliveries, diverticulosis with colonoscopy, and hiatal hernia,  endoscopy in 2004.   Medical problems include hypertension for which she is taking  benazepril, baby-coated aspirin, sertraline 50 mg one-half daily for  depression.  She is allergic to the erythromycin product PCE.   Family history is positive for stroke, heart attack, diabetes, gout, and  hypothyroidism.   She does not smoke or drink.   One son did have rheumatoid arthritis.   Her weight was down approximately 8 pounds to 149, temperature was 99.7,  pulse 64, respiratory rate 16, and blood pressure 120/72.  She had no  icterus or jaundice.  An S4 was noted with a grade one-half systolic  murmur.  She had no organomegaly or lymphadenopathy.  She was generally  stiff and described pain sitting up from the exam table but there were  no striking rheumatologic findings.   Her CBC and differential revealed a hematocrit of 25.8; sed rate was 97.  Rheumatoid factor was negative.  CPK was normal as were potassium, BUN,  creatinine, uric acid, and liver function.  Thyroid functions were also  normal.   Films of the lumbosacral spine revealed degenerative joint disease at L4-  L5 and mild retrolisthesis at L2-L3 which was essentially stable.  She  also exhibited a large hiatal hernia with no acute findings.   She was seen in followup on February 13.  She states that her pain in  the lumbosacral spine was better with ambulation.  She had no pain  sitting.  The pain would last for a few seconds after standing.  She  also described some discomfort in the left lower extremity.   She has not been performing mammograms and this risk was discussed.   Weight was stable, blood pressure was 90/54.  She was in no distress and  the exam was essentially unchanged.  She had negative straight leg  raising.  Deep tendon reflexes were 1.5+.   She was given Darvocet-N 100 for the lumbosacral pain because of the  history of hiatal hernia.   The Crestor which had been held because of the possibility of drug-  induced myalgia was restarted.   Stool cards had been collected in January and had been negative.  Polymyalgia rheumatica variant or spinal stenosis were considered in the  differential diagnosis.  Repeat labs revealed a hematocrit of 23.7.  This dramatic decrease in HCT will be evaluated & back pain with GI  consult & CT of pelvis & abdomen.   Iron was low at 12% with normal over 42.  Folate was normal.  B12 levels  extremely low.  An MRI of the lumbosacral spine will be performed if the  anemia workup is negative. B12 shots will be initiated.   Should significant spinal stenosis be found, then consideration will be  given to epidural steroids.  This is problematic in view of her  osteoporosis.  She has not been able to take the bisphosphonates because  of her hiatal hernia, which has been symptomatic.  Neurosurgical  consultation may be necessary.  Unexplained is the high sed rate and  rheumatologic assessment may also be necessary despite the negative  rheumatoid arthritis  factor.     Titus Dubin. Alwyn Ren, MD,FACP,FCCP  Electronically Signed    [facility]/MedQ  DD: 08/06/2006  DT: 08/06/2006  Job #: [id number]

## 2010-11-04 NOTE — Letter (Signed)
Oct 27, 2006    Rosalyn Gess. Norins, MD  [address]  [place], [place] [address]   RE:  Kara Burnett, Kara Burnett  MRN:  [id number]  /  DOB:  1924-06-22   Dear Casimiro Needle:   Dr. [facility] sent me copies of Mrs. Degraffenreid endoscopy and  colonoscopy reports; he did not realize that she had transferred her  care to your service.  I was relieved that there was no serious colonic  lesion contributing to her iron deficiency anemia.  Striking is her  relative lack of symptoms despite the marked anemia; specifically she  did not present with angina or dyspnea.  I think this reflects her stoic  character. She has suffered family tragedy and borne this with courage.   I appreciate your evaluation; I had concern about the steroids for  presumed polymyalgia rheumatica variant because of associated risks &  had planned  to consult rheumatologist to confirm the diagnosis because  of my concerns with this therapy.   Her son did die of coronary artery disease, but had profound  hypothyroidism.  I am not sure Mrs. Schlegel is aware of this.  With this  history and her B12 deficiency, I questioned some  autoimmune process and had considered bone marrow biopsy if the anemia  persisted or progressed.   Thank you for assuming her care; she is a delightful lady and you will  enjoy working with her.  I hope this additional family history is of  value.    Sincerely,      Titus Dubin. Alwyn Ren, MD,FACP,FCCP  Electronically Signed    [facility]/MedQ  DD: 10/27/2006  DT: 10/27/2006  Job #: [id number]

## 2010-11-09 ENCOUNTER — Other Ambulatory Visit: Payer: Self-pay | Admitting: Internal Medicine

## 2010-12-01 ENCOUNTER — Other Ambulatory Visit: Payer: Self-pay | Admitting: Internal Medicine

## 2011-01-01 ENCOUNTER — Other Ambulatory Visit: Payer: Self-pay | Admitting: Internal Medicine

## 2011-02-07 ENCOUNTER — Other Ambulatory Visit: Payer: Self-pay | Admitting: Internal Medicine

## 2011-02-08 MED ORDER — TRAMADOL HCL 50 MG PO TABS: ORAL_TABLET | ORAL | Status: DC

## 2011-02-08 NOTE — Telephone Encounter (Signed)
RX sent to pharmacy  

## 2011-03-29 LAB — CBC
HCT: 39.7
MCHC: 32.4
Platelets: 419 — ABNORMAL HIGH
RDW: 17.7 — ABNORMAL HIGH

## 2011-03-29 LAB — BASIC METABOLIC PANEL
BUN: 9
CO2: 28
GFR calc non Af Amer: >60
Glucose, Bld: 105 — ABNORMAL HIGH
Potassium: 3.2 — ABNORMAL LOW

## 2011-03-31 LAB — CBC
HCT: 34.9 — ABNORMAL LOW
MCV: 89.1
Platelets: 318
RDW: 17.5 — ABNORMAL HIGH
WBC: 10.1

## 2011-03-31 LAB — POCT CARDIAC MARKERS
Operator id: 151321
Troponin i, poc: <0.05

## 2011-03-31 LAB — POCT I-STAT CREATININE
Creatinine, Ser: 1
Operator id: 151321

## 2011-03-31 LAB — I-STAT 8, (EC8 V) (CONVERTED LAB)
BUN: 19
Chloride: 105
Glucose, Bld: 92
HCT: 38
Potassium: 3.9
pCO2, Ven: 45.3
pH, Ven: 7.358 — ABNORMAL HIGH

## 2011-05-07 ENCOUNTER — Other Ambulatory Visit: Payer: Self-pay | Admitting: Internal Medicine

## 2011-05-08 NOTE — Telephone Encounter (Signed)
Patient needs to schedule a CPX  

## 2011-07-02 ENCOUNTER — Other Ambulatory Visit: Payer: Self-pay | Admitting: Internal Medicine

## 2011-07-03 NOTE — Telephone Encounter (Signed)
Patient needs to schedule a CPX  

## 2011-07-03 NOTE — Telephone Encounter (Signed)
Please verify last office visit.Please  schedule fasting Labs : BMET (401.9) & OV .Please  Refill # 30 pills

## 2011-07-03 NOTE — Telephone Encounter (Signed)
Please advise refill? 

## 2011-08-07 ENCOUNTER — Other Ambulatory Visit: Payer: Self-pay | Admitting: Internal Medicine

## 2011-08-07 NOTE — Telephone Encounter (Signed)
Dr.Hopper please advise, last seen 2011. Last filled 05/07/2011, with a note OV needed, no pending appointment

## 2011-08-08 NOTE — Telephone Encounter (Signed)
Patient needs to schedule a CPX  

## 2011-08-08 NOTE — Telephone Encounter (Signed)
Refill x30 pills; office visit needed before  Additional refills

## 2011-08-10 ENCOUNTER — Other Ambulatory Visit: Payer: Self-pay | Admitting: Internal Medicine

## 2011-08-10 NOTE — Telephone Encounter (Signed)
I called and spoke with pharmacist rx was received on 08/07/2011, duplicate sent in error

## 2011-08-21 ENCOUNTER — Other Ambulatory Visit: Payer: Self-pay | Admitting: Internal Medicine

## 2011-08-21 NOTE — Telephone Encounter (Signed)
Prescription sent to the pharmacy.

## 2011-09-03 ENCOUNTER — Other Ambulatory Visit: Payer: Self-pay | Admitting: Internal Medicine

## 2011-09-04 NOTE — Telephone Encounter (Signed)
Dr.Hopper please advise, med was filled last month with an indication OV due. No pending appointment schedule. Last OV 05/09/2010

## 2011-09-04 NOTE — Telephone Encounter (Signed)
OK but MAKE her an appt within next 4-6 weeks PLEASE

## 2011-09-05 NOTE — Telephone Encounter (Signed)
LMOVM for patient to call the office. Home # is incorrect. Left msg on mobile #.

## 2011-09-06 NOTE — Telephone Encounter (Signed)
Patient is scheduled for 09-21-11.

## 2011-09-17 ENCOUNTER — Other Ambulatory Visit: Payer: Self-pay | Admitting: Internal Medicine

## 2011-09-18 NOTE — Telephone Encounter (Signed)
Rx sent 

## 2011-09-18 NOTE — Telephone Encounter (Signed)
Last filled 09-03-11 #30, pending OV 09-21-11

## 2011-09-18 NOTE — Telephone Encounter (Signed)
OK X1 

## 2011-09-21 ENCOUNTER — Encounter: Payer: Self-pay | Admitting: Internal Medicine

## 2011-09-21 ENCOUNTER — Ambulatory Visit (INDEPENDENT_AMBULATORY_CARE_PROVIDER_SITE_OTHER)
Admission: RE | Admit: 2011-09-21 | Discharge: 2011-09-21 | Disposition: A | Discharge status: Home / Self Care | Payer: Medicare Other | Source: Ambulatory Visit | Attending: Internal Medicine | Admitting: Internal Medicine

## 2011-09-21 ENCOUNTER — Ambulatory Visit (INDEPENDENT_AMBULATORY_CARE_PROVIDER_SITE_OTHER): Payer: Medicare Other | Admitting: Internal Medicine

## 2011-09-21 VITALS — BP 126/70 | HR 84 | Wt 129.0 lb

## 2011-09-21 DIAGNOSIS — E559 Vitamin D deficiency, unspecified: Secondary | ICD-10-CM

## 2011-09-21 DIAGNOSIS — I10 Essential (primary) hypertension: Secondary | ICD-10-CM

## 2011-09-21 DIAGNOSIS — E785 Hyperlipidemia, unspecified: Secondary | ICD-10-CM

## 2011-09-21 DIAGNOSIS — Z0000 Encounter for general adult medical examination without abnormal findings: Secondary | ICD-10-CM

## 2011-09-21 DIAGNOSIS — M545 Low back pain: Secondary | ICD-10-CM

## 2011-09-21 DIAGNOSIS — M810 Age-related osteoporosis without current pathological fracture: Secondary | ICD-10-CM

## 2011-09-21 DIAGNOSIS — Z23 Encounter for immunization: Secondary | ICD-10-CM

## 2011-09-21 DIAGNOSIS — D510 Vitamin B12 deficiency anemia due to intrinsic factor deficiency: Secondary | ICD-10-CM

## 2011-09-21 DIAGNOSIS — K219 Gastro-esophageal reflux disease without esophagitis: Secondary | ICD-10-CM

## 2011-09-21 LAB — HEPATIC FUNCTION PANEL
AST: 25 U/L (ref 0–37)
Alkaline Phosphatase: 46 U/L (ref 39–117)
Total Bilirubin: 0.6 mg/dL (ref 0.3–1.2)

## 2011-09-21 LAB — CBC WITH DIFFERENTIAL/PLATELET
Basophils Absolute: 0 10*3/uL (ref 0.0–0.1)
Lymphocytes Relative: 25.9 % (ref 12.0–46.0)
Lymphs Abs: 1.8 10*3/uL (ref 0.7–4.0)
Monocytes Relative: 5.9 % (ref 3.0–12.0)
Platelets: 267 10*3/uL (ref 150.0–400.0)
RDW: 14.6 % (ref 11.5–14.6)

## 2011-09-21 LAB — LIPID PANEL
Cholesterol: 234 mg/dL — ABNORMAL HIGH (ref 0–200)
HDL: 66.2 mg/dL (ref >39.00)
Total CHOL/HDL Ratio: 4
VLDL: 22.2 mg/dL (ref 0.0–40.0)

## 2011-09-21 LAB — BASIC METABOLIC PANEL
CO2: 29 mEq/L (ref 19–32)
Chloride: 102 mEq/L (ref 96–112)
Creatinine, Ser: 1 mg/dL (ref 0.4–1.2)
GFR: 56.67 mL/min — ABNORMAL LOW (ref >60.00)
Potassium: 4.2 mEq/L (ref 3.5–5.1)
Sodium: 141 mEq/L (ref 135–145)

## 2011-09-21 NOTE — Progress Notes (Signed)
Addended by: Edwena Felty T on: 09/21/2011 12:19 PM   Modules accepted: Orders

## 2011-09-21 NOTE — Patient Instructions (Signed)
Preventive Health Care:  Walking is especially valuable in preventing Osteoporosis. Eat a low-fat diet with lots of fruits and vegetables, up to 7-9 servings per day. Depression is common in our stressful world. If you're feeling down or losing interest in things you normally enjoy, please call. Blood Pressure Goal  Ideally is an AVERAGE < 135/85. This AVERAGE should be calculated from @ least 5-7 BP readings taken @ different times of day on different days of week. You should not respond to isolated BP readings , but rather the AVERAGE for that week . Please try to go on My Chart within the next 24 hours to allow me to release the results directly to you.  Order for x-rays entered into  the computer; these will be performed at [address] [facility]. across from [facility]. No appointment is necessary.

## 2011-09-21 NOTE — Progress Notes (Signed)
Subjective:    Patient ID: Kara Burnett, female    DOB: 1925-04-22, 78 y.o.   MRN: [id number]  HPI Medicare Wellness Visit:  The following psychosocial & medical history were reviewed as required by Medicare.   Social history: caffeine: none , alcohol: none ,  tobacco use :never & exercise : no.   Home & personal  safety / fall risk: occasionally as "wobbly " gait( PMH of sacral fracture), activities of daily living: no limitations , seatbelt use : yes , and smoke alarm employment : yes .  Power of Attorney/Living Will status :in place  Vision ( as recorded per Nurse) & Hearing  evaluation : hearing evaluation 5 years ago; no Ophth exam > 3 yrs. Orientation :09/23/11 ,Pres[place] , memory & recall :2 of 3 , spelling testing: excellent ,and mood & affect : normal . Depression / anxiety: yes, meds declined Travel history : never , immunization status :? PNA & tetanus needed , transfusion history: with severe anemai, and preventive health surveillance ( colonoscopies, BMD , etc as per protocol/ [facility]): colonoscopy up to date, Dental care:  Seen every 6 months . Chart reviewed &  Updated. Active issues reviewed & addressed.       Review of Systems HYPERTENSION: Disease Monitoring: Blood pressure range-120/70-80 Chest pain, palpitations-no       Dyspnea- no Medications: Compliance- yes, but on 10 mg Benazepril  Lightheadedness,Syncope- no   Edema- no   HYPERLIPIDEMIA: Disease Monitoring: See symptoms for Hypertension Medications: Compliance- yes  Abd pain, bowel changes- no   Muscle aches-no   Currently 6 months she's been having low back pain which is reminiscent of the pain she had following a sacral fracture. She fell from her chair 5-6  years ago several years after the sacroplexy. She denies incontinence of urine or stool. She also denies numbness or tingling or weakness in her legs        Objective:   Physical Exam Gen.: Healthy and well-nourished in appearance. Alert,  appropriate and cooperative throughout exam. Majority of history provided by her daughter due to  severe hearing loss  Eyes: No corneal or conjunctival inflammation noted. Ptosis OD . OS. Extraocular motion intact. Vision grossly normal. Ears: External  ear exam reveals no significant lesions or deformities. Canals clear; left TM scarred. Hearing is severely decreased  bilaterally. Nose: External nasal exam reveals no deformity or inflammation. Nasal mucosa are pink and moist. No lesions or exudates noted.  Mouth: Oral mucosa and oropharynx reveal no lesions or exudates. Teeth in good repair. Neck: No deformities, masses, or tenderness noted. Range of motion decreased. Thyroid normal. Lungs: Normal respiratory effort; chest expands symmetrically. Lungs are clear to auscultation without rales, wheezes, or increased work of breathing. Heart: Normal rate and rhythm. Normal S1 and S2. No gallop, click, or rub. Grade 1/2- 1 over 6 systolic murmur  Abdomen: Bowel sounds normal; abdomen soft and nontender. No masses, organomegaly or hernias noted.  Musculoskeletal/extremities: Marked lordosis  noted of  the thoracic  spine. No clubbing, cyanosis, edema, or deformity noted. Range of motion decreased @ knees; neg SLR .Tone & strength  normal.Joints normal. Nail health  good. She is able to lie back and sit up with minimal aid Vascular: Carotid, radial artery, dorsalis pedis and  posterior tibial pulses are full and equal. No bruits present. Neurologic:  Deep tendon reflexes symmetrical and normal.          Skin: Intact without suspicious lesions or rashes. Lymph: No cervical,  axillary lymphadenopathy present. Psych: Mood and affect are normal. Normally interactive                                                                                         Assessment & Plan:  #1 Medicare Wellness Exam; criteria met ; data entered #2 Problem List reviewed ; Assessment/ Recommendations made  #3 severe  hearing loss; this is likely contributing to the depression  #4 depression, meds declined  #5 chronic low back pain status post intervention; tramadol apparently of benefit Plan: see Orders

## 2011-09-22 LAB — VITAMIN D 25 HYDROXY (VIT D DEFICIENCY, FRACTURES): Vit D, 25-Hydroxy: 41 ng/mL (ref 30–89)

## 2011-09-26 ENCOUNTER — Other Ambulatory Visit: Payer: Self-pay | Admitting: Internal Medicine

## 2011-09-26 ENCOUNTER — Telehealth: Payer: Self-pay | Admitting: *Deleted

## 2011-09-26 DIAGNOSIS — M4850XA Collapsed vertebra, not elsewhere classified, site unspecified, initial encounter for fracture: Secondary | ICD-10-CM

## 2011-09-26 NOTE — Telephone Encounter (Signed)
Pt daughter states that Pt has had this procedure to stabilize the vertebra (vertebroplasty) done before by Dr Alfredo Batty who is with [facility]. Pt would like to go back to him if all possible and prefers a afternoon appt.

## 2011-10-02 ENCOUNTER — Other Ambulatory Visit: Payer: Self-pay | Admitting: Internal Medicine

## 2011-10-02 DIAGNOSIS — M4850XA Collapsed vertebra, not elsewhere classified, site unspecified, initial encounter for fracture: Secondary | ICD-10-CM

## 2011-10-02 NOTE — Telephone Encounter (Signed)
Dr. Alwyn Ren, per Duwayne Heck with [facility], the current order entered for pt to have vertebroplasty is incorrect.  Please remove current order, enter as...Marland KitchenMarland KitchenMarland Kitchen     DG EPI, hit "tab" and the choice of DG EPI/VERTEBROPLASTY should be available.  Also, prior to patient having this done, he needs an MRI.  If patient unable to have an MRI, will need a CT & a Bone Scan.

## 2011-10-02 NOTE — Progress Notes (Signed)
Addended byDuaine Dredge, Zenaya Ulatowski L on: 10/02/2011 11:06 AM   Modules accepted: Orders

## 2011-10-02 NOTE — Telephone Encounter (Signed)
Discuss with patient daughter referral put in.

## 2011-10-02 NOTE — Telephone Encounter (Signed)
To prevent any complications related to this invasive procedure; I do recommend that we have the back specialists reassess her before considering  vertebro plasty. We'll cancel the order for that as it also requires MRI.  I recommend an   Consultation with a non operative back specialist  (Dr Maurice Small or Dr Ethelene Hal ) to determine optimal therapy; please inform me if you have a physician preference.

## 2011-10-02 NOTE — Telephone Encounter (Signed)
Left message to call office

## 2011-10-10 ENCOUNTER — Telehealth: Payer: Self-pay | Admitting: Internal Medicine

## 2011-10-10 NOTE — Telephone Encounter (Signed)
Dr. Alwyn Ren, in reference to referral to [facility]. [facility], per my call to them for status on this patient, they are not scheduling her to see Ramos.  Reason is that she had surgery 6 years ago, and per their office protocol, this makes patient a 2nd opinion, even though she is not.  Would you like me to just refer her to back specialists elsewhere?  It has become very difficult to get patients seen at all or in a timely manner with [facility].  Please advise.

## 2011-10-10 NOTE — Telephone Encounter (Signed)
Dr.Hopper please advise 

## 2011-10-10 NOTE — Telephone Encounter (Signed)
Refer to Dr Maurice Small @ [address] [address] [facility] [address]

## 2011-10-13 NOTE — Telephone Encounter (Signed)
Appointment with Dr. Maurice Small on 10-31-11.  I have informed patient & her daughter, Rosey Bath.

## 2011-11-01 ENCOUNTER — Telehealth: Payer: Self-pay | Admitting: Internal Medicine

## 2011-11-01 DIAGNOSIS — R5383 Other fatigue: Secondary | ICD-10-CM

## 2011-11-01 DIAGNOSIS — M810 Age-related osteoporosis without current pathological fracture: Secondary | ICD-10-CM

## 2011-11-01 NOTE — Telephone Encounter (Signed)
Orders placed.

## 2011-11-01 NOTE — Telephone Encounter (Signed)
We referred this pt to [facility] and they have requested that she have the following lab tests performed: CMP with estimated GFR, Parathyroid hormone intact with calcium. Dx 733.01, 780.79  The pt would like to go to the [place] office to have labs done. Can you please place the order? I can call the pt and let her know.

## 2011-11-04 ENCOUNTER — Other Ambulatory Visit: Payer: Self-pay | Admitting: Internal Medicine

## 2011-11-07 ENCOUNTER — Ambulatory Visit: Payer: Medicare Other

## 2011-11-07 DIAGNOSIS — M810 Age-related osteoporosis without current pathological fracture: Secondary | ICD-10-CM

## 2011-11-07 DIAGNOSIS — R5381 Other malaise: Secondary | ICD-10-CM

## 2011-11-07 LAB — COMPREHENSIVE METABOLIC PANEL
Albumin: 4 g/dL (ref 3.5–5.2)
CO2: 33 mEq/L — ABNORMAL HIGH (ref 19–32)
Calcium: 10.9 mg/dL — ABNORMAL HIGH (ref 8.4–10.5)
Chloride: 103 mEq/L (ref 96–112)
GFR: 54.12 mL/min — ABNORMAL LOW (ref >60.00)
Glucose, Bld: 86 mg/dL (ref 70–99)
Potassium: 5.1 mEq/L (ref 3.5–5.1)
Sodium: 144 mEq/L (ref 135–145)
Total Bilirubin: 0.6 mg/dL (ref 0.3–1.2)
Total Protein: 6.4 g/dL (ref 6.0–8.3)

## 2011-12-02 ENCOUNTER — Other Ambulatory Visit: Payer: Self-pay | Admitting: Internal Medicine

## 2011-12-04 NOTE — Telephone Encounter (Signed)
Last OV 09-21-11, last filled 11-04-11 #30

## 2011-12-04 NOTE — Telephone Encounter (Signed)
OK X1 

## 2011-12-31 ENCOUNTER — Other Ambulatory Visit: Payer: Self-pay | Admitting: Internal Medicine

## 2012-02-03 ENCOUNTER — Other Ambulatory Visit: Payer: Self-pay | Admitting: Internal Medicine

## 2012-02-05 NOTE — Telephone Encounter (Signed)
Last OV 09-21-11, last filled 12-31-11 #30

## 2012-02-29 ENCOUNTER — Other Ambulatory Visit: Payer: Self-pay | Admitting: Internal Medicine

## 2012-03-20 ENCOUNTER — Other Ambulatory Visit: Payer: Self-pay | Admitting: Internal Medicine

## 2012-03-29 ENCOUNTER — Other Ambulatory Visit: Payer: Self-pay | Admitting: Internal Medicine

## 2012-03-31 ENCOUNTER — Encounter: Payer: Self-pay | Admitting: Internal Medicine

## 2012-04-01 NOTE — Telephone Encounter (Signed)
Dr.Hopper please advise if additional refills to be given

## 2012-04-01 NOTE — Telephone Encounter (Signed)
OK X1 

## 2012-04-02 ENCOUNTER — Telehealth: Payer: Self-pay | Admitting: Internal Medicine

## 2012-04-02 NOTE — Telephone Encounter (Signed)
Lmovm for pt to call office. °

## 2012-04-02 NOTE — Telephone Encounter (Signed)
Message copied by Marshell Garfinkel on Tue Apr 02, 2012 12:46 PM ------      Message from: Pecola Lawless      Created: Sun Mar 31, 2012 10:24 AM       There are injectable & parenteral  Therapies ( Prolia & Reclast) for osteoporosis which can be pursued if you desire. Please ask her to schedule appt to discuss options. Please bring records of any prior bone building therapy, family history of Osteoporosis, past history of any fractures, & all meds & supplements (especially vitamin D3 dose).

## 2012-04-02 NOTE — Telephone Encounter (Signed)
Spoke with pt daughter made pt appt 04/10/12 at 3pm to discuss therapies for osteoporosis per [facility].   MW

## 2012-04-04 ENCOUNTER — Other Ambulatory Visit: Payer: Self-pay | Admitting: Internal Medicine

## 2012-04-10 ENCOUNTER — Encounter: Payer: Self-pay | Admitting: Internal Medicine

## 2012-04-10 ENCOUNTER — Ambulatory Visit (INDEPENDENT_AMBULATORY_CARE_PROVIDER_SITE_OTHER): Payer: Medicare Other | Admitting: Internal Medicine

## 2012-04-10 DIAGNOSIS — M810 Age-related osteoporosis without current pathological fracture: Secondary | ICD-10-CM

## 2012-04-10 DIAGNOSIS — E559 Vitamin D deficiency, unspecified: Secondary | ICD-10-CM

## 2012-04-10 NOTE — Progress Notes (Signed)
  Subjective:    Patient ID: Kara Burnett, female    DOB: Oct 01, 1924, 78 y.o.   MRN: [id number]  HPI  Although she has never had a bone density study; she has had sacro plasty for  bilateral ala sacral fractures  03/2007. Bone scan had revealed a small rib fracture as well.  Her vitamin D level has been low normal. Calcium was elevated at 11.1 with a normal PTH level in May of this year. Appropriately  she has not been taking her calcium.  She has a history of anemia; hematocrit was normal in March of this year. Her total protein has been normal. The bone scan did not suggest  bone disease  Review of Systems  She intermittently wears a brace for her back pain with some response.  She's had no bleeding dyscrasias such as epistaxis, hemoptysis, melena, rectal bleeding, or abnormal bruising or bleeding.    Objective:   Physical Exam  She appears much younger than her stated age  She has no lymphadenopathy about the neck or axilla.  There no significant arthritic changes in hands; there is some ulnar deviation.  She has mild crepitus and decreased range of motion of the knees.  There is marked lordosis of the thoracic spine        Assessment & Plan:

## 2012-04-10 NOTE — Assessment & Plan Note (Signed)
BMD ordered; If she does not have significant osteoporosis, it would be  a major surprise

## 2012-04-10 NOTE — Patient Instructions (Addendum)
If you activate My Chart; the results can be released to you as soon as they populate from the lab. If you choose not to use this program; the labs have to be reviewed, copied & mailed   causing a delay in getting the results to you. 

## 2012-04-10 NOTE — Assessment & Plan Note (Signed)
Calcium will be rechecked today as she's been off the calcium supplement

## 2012-04-11 LAB — CALCIUM: Calcium: 9.6 mg/dL (ref 8.4–10.5)

## 2012-04-17 ENCOUNTER — Telehealth: Payer: Self-pay | Admitting: Internal Medicine

## 2012-04-17 NOTE — Telephone Encounter (Signed)
Caller: [name]/Patient; Patient Name: Kara Burnett; PCP: Marga Melnick; Best Callback Phone Number: [phone] States has missed call from office concerning bone density test. States told caller that would need to speak to daughter as she has to bring patient to office. Told caller to call back when daughter was available to help schedule but no one has called daughter or patient back. PLEASE CALL PATIENT'S DAUGHTER/TERESA TO SCHEDULE [phone]

## 2012-04-17 NOTE — Telephone Encounter (Signed)
I called patient's daughter and left message for her to please call and clarify this message. If patient is needing to schedule an appointment, patient's daughter to call and schedule, if she is needing something else I requested that when she calls off to ask for me: Chrae/CMA

## 2012-04-18 NOTE — Telephone Encounter (Signed)
I returned Aggie Cosier, patient's daughter's call, but there was no answer.  I left a voicemail for her to return my call so she can pick the date & time she would like to schedule the dexa.

## 2012-04-18 NOTE — Telephone Encounter (Signed)
I called patient's daughter again to clarify message. Rosey Bath states her mother received a call from the office Lowanda Foster) about setting up a Bone Density exam and they just need the info about that appointment date and time.  I reviewed chart and was unable to locate information, Rosey Bath was informed [facility] referral coordinator will contact with information. (Renee was on another call at the time I was speaking with Rosey Bath).

## 2012-04-23 ENCOUNTER — Ambulatory Visit (INDEPENDENT_AMBULATORY_CARE_PROVIDER_SITE_OTHER)
Admission: RE | Admit: 2012-04-23 | Discharge: 2012-04-23 | Disposition: A | Discharge status: Home / Self Care | Payer: Medicare Other | Source: Ambulatory Visit

## 2012-04-23 DIAGNOSIS — M810 Age-related osteoporosis without current pathological fracture: Secondary | ICD-10-CM

## 2012-05-03 ENCOUNTER — Other Ambulatory Visit: Payer: Self-pay | Admitting: Internal Medicine

## 2012-05-07 ENCOUNTER — Other Ambulatory Visit: Payer: Self-pay | Admitting: Internal Medicine

## 2012-05-30 ENCOUNTER — Other Ambulatory Visit: Payer: Self-pay | Admitting: Internal Medicine

## 2012-06-30 ENCOUNTER — Other Ambulatory Visit: Payer: Self-pay | Admitting: Internal Medicine

## 2012-07-02 NOTE — Telephone Encounter (Signed)
Called patient to inform her to stop by the office to sign controlled substance, no answer. Unable to leave message, no voicemail. I will try to reach patient later

## 2012-07-02 NOTE — Telephone Encounter (Signed)
3 60 , R x 1

## 2012-07-02 NOTE — Telephone Encounter (Signed)
Hopp please advise if ok to give additional refills, refill history shows medication requested/refilled about every 30 days  Side Note: patient will be instructed to sign controlled substance contract

## 2012-07-03 ENCOUNTER — Encounter: Payer: Self-pay | Admitting: *Deleted

## 2012-07-03 NOTE — Telephone Encounter (Signed)
Pt aware will come in to pick up Rx and sign agreement.

## 2012-07-23 ENCOUNTER — Other Ambulatory Visit: Payer: Self-pay | Admitting: Internal Medicine

## 2012-08-03 ENCOUNTER — Other Ambulatory Visit: Payer: Self-pay

## 2012-10-16 ENCOUNTER — Ambulatory Visit (INDEPENDENT_AMBULATORY_CARE_PROVIDER_SITE_OTHER)
Admission: RE | Admit: 2012-10-16 | Discharge: 2012-10-16 | Disposition: A | Discharge status: Home / Self Care | Payer: Medicare Other | Source: Ambulatory Visit | Attending: Internal Medicine | Admitting: Internal Medicine

## 2012-10-16 ENCOUNTER — Encounter: Payer: Self-pay | Admitting: Internal Medicine

## 2012-10-16 ENCOUNTER — Ambulatory Visit (INDEPENDENT_AMBULATORY_CARE_PROVIDER_SITE_OTHER): Payer: Medicare Other | Admitting: Internal Medicine

## 2012-10-16 ENCOUNTER — Telehealth: Payer: Self-pay | Admitting: Internal Medicine

## 2012-10-16 VITALS — BP 120/68 | HR 85 | Temp 98.7°F | Ht 62.0 in | Wt 123.4 lb

## 2012-10-16 DIAGNOSIS — R0989 Other specified symptoms and signs involving the circulatory and respiratory systems: Secondary | ICD-10-CM

## 2012-10-16 DIAGNOSIS — J309 Allergic rhinitis, unspecified: Secondary | ICD-10-CM | POA: Insufficient documentation

## 2012-10-16 DIAGNOSIS — J069 Acute upper respiratory infection, unspecified: Secondary | ICD-10-CM

## 2012-10-16 DIAGNOSIS — H109 Unspecified conjunctivitis: Secondary | ICD-10-CM | POA: Insufficient documentation

## 2012-10-16 MED ORDER — ERYTHROMYCIN 5 MG/GM OP OINT: TOPICAL_OINTMENT | OPHTHALMIC | Status: DC

## 2012-10-16 MED ORDER — AZITHROMYCIN 200 MG/5ML PO SUSR: ORAL | Status: DC

## 2012-10-16 MED ORDER — METHYLPREDNISOLONE ACETATE 80 MG/ML IJ SUSP
80.0000 mg | Freq: Once | INTRAMUSCULAR | Status: AC
  Administered 2012-10-16: 80 mg via INTRAMUSCULAR

## 2012-10-16 NOTE — Assessment & Plan Note (Signed)
?   Significance, ? pna - for cxr

## 2012-10-16 NOTE — Assessment & Plan Note (Signed)
Mild to mod, for antibx course,  to f/u any worsening symptoms or concerns 

## 2012-10-16 NOTE — Patient Instructions (Addendum)
You had the steroid shot today Please take all new medication as prescribed - the liquid antibiotic, and eye ointment Please continue all other medications as before, and refills have been done if requested. Please go to the XRAY Department in the Basement (go straight as you get off the elevator) for the x-ray testing You will be contacted by phone if any changes need to be made immediately.  Otherwise, you will receive a letter about your results with an explanation, but please check with MyChart first. Thank you for enrolling in MyChart. Please follow the instructions below to securely access your online medical record. MyChart allows you to send messages to your doctor, view your test results, renew your prescriptions, schedule appointments, and more. If you do not remember any of this information, you will have to contact your MyChart help desk at [phone]) to help you regain access to your MyChart account. Zella Ball should ask you for the cell phone number to call later today if needed

## 2012-10-16 NOTE — Telephone Encounter (Signed)
Patient wants to be seen by Dr. Alwyn Ren today she states she is very sick. She did not want to leave a message with the triage nurse.

## 2012-10-16 NOTE — Assessment & Plan Note (Signed)
Mild to mod, for depomedrol IM,  to f/u any worsening symptoms or concerns 

## 2012-10-16 NOTE — Progress Notes (Signed)
Subjective:    Patient ID: Kara Burnett, female    DOB: 1925/03/22, 79 y.o.   MRN: [id number]  HPI  Here with daughter, pt of Dr Alwyn Ren, doing well until  Here with 2-3 days acute onset fever, facial pain, pressure, headache, general weakness and malaise, and greenish d/c, with mild ST and cough, but pt denies chest pain, wheezing, increased sob or doe, orthopnea, PND, increased LE swelling, palpitations, dizziness or syncope.  Pt denies polydipsia, polyuria. Pt denies new neurological symptoms such as new headache, or facial or extremity weakness or numbness  Has HOH, did not wear heaing aids today Daughter states has some difficulty with pills, asks for liquid meds if possible. Also no prior hx of allergies but Does have several wks ongoing nasal allergy symptoms with clearish congestion, itch and sneezing  Past Medical History  Diagnosis Date  . Hypertension   . Hyperlipidemia   . GERD (gastroesophageal reflux disease)   . Anemia   . Osteoporosis   . Herpes zoster    Past Surgical History  Procedure Laterality Date  . Cholecystectomy  1990  . Sacroplasty      post pelvic fracture  . Colonoscopy  2008    negative  . Cataract surgery      Bilaterally    reports that she has never smoked. She does not have any smokeless tobacco history on file. She reports that she does not drink alcohol or use illicit drugs. family history includes Diabetes in her sister; Heart disease in her father, sister, and son; Hypothyroidism in her son; and Stroke in her father and mother. Allergies  Allergen Reactions  . Ciprofloxacin     Nausea & vomiting    Current Outpatient Prescriptions on File Prior to Visit  Medication Sig Dispense Refill  . amitriptyline (ELAVIL) 10 MG tablet TAKE 1 TABLET (10 MG TOTAL) BY MOUTH AT BEDTIME.  30 tablet  1  . aspirin 81 MG tablet Take 81 mg by mouth daily.      . benazepril (LOTENSIN) 10 MG tablet TAKE 1 TABLET BY MOUTH DAILY  90 tablet  1  . CRESTOR 20 MG  tablet TAKE 1 TABLET EVERY DAY  30 tablet  4  . traMADol (ULTRAM) 50 MG tablet TAKE 1 TO 2 TABLETS BY MOUTH EVERY 6 HOURS AS NEEDED FOR JOINT PAIN  60 tablet  1   No current facility-administered medications on file prior to visit.   Review of Systems  Constitutional: Negative for unexpected weight change, or unusual diaphoresis  HENT: Negative for tinnitus.   Eyes: Negative for photophobia and visual disturbance.  Respiratory: Negative for choking and stridor.   Gastrointestinal: Negative for vomiting and blood in stool.  Genitourinary: Negative for hematuria and decreased urine volume.  Musculoskeletal: Negative for acute joint swelling Skin: Negative for color change and wound.  Neurological: Negative for tremors and numbness other than noted  Psychiatric/Behavioral: Negative for decreased concentration or  hyperactivity.       Objective:   Physical Exam BP 120/68  Pulse 85  Temp(Src) 98.7 F (37.1 C) (Oral)  Ht 5\' 2"  (1.575 m)  Wt 123 lb 6 oz (55.963 kg)  BMI 22.56 kg/m2  SpO2 97% VS noted, mild ill Constitutional: Pt appears well-developed and well-nourished.  HENT: Head: NCAT.  Right Ear: External ear normal.  Left Ear: External ear normal.  Bilat tm's with mild erythema.  Max sinus areas non tender.  Pharynx with mild erythema, no exudate Eyes: bilat Conjunctivae with weepy  exudates yellowishgreenishk, and EOM are normal. Pupils are equal, round, and reactive to light.  Neck: Normal range of motion. Neck supple.  Cardiovascular: Normal rate and regular rhythm.   Pulmonary/Chest: Effort normal and breath sounds normal. except for few RLL rales Abd:  Soft, NT, non-distended, + BS Neurological: Pt is alert. Motor grossly intact Skin: Skin is warm. No erythema. No LE edema Psychiatric: Pt behavior is normal.    Assessment & Plan:

## 2012-10-16 NOTE — Telephone Encounter (Signed)
Spoke with patient: runny nose and cough (coughing up green mucous) x 2 weeks or longer, patient would like to be seen today. Patient is willing to be seen at another facility. Patient requested that I call her daughter Rosey Bath for she will have to take her to appointment.   [place] @ [phone], patient to be seen at [facility] office by Dr.John at 2:00 pm.

## 2012-10-18 ENCOUNTER — Telehealth: Payer: Self-pay

## 2012-10-18 NOTE — Telephone Encounter (Signed)
Phone call from pt's daughter, Aggie Cosier, requesting xray results from Wednesday. Please advise.

## 2012-10-18 NOTE — Telephone Encounter (Signed)
Dr. Jonny Ruiz released xray results to my chart:  results okay, no pneumonia - to continue same treatment With Z-Pak as prescribed

## 2012-10-18 NOTE — Telephone Encounter (Signed)
Phone call to pt's daughter Aggie Cosier letting her know the xray results are okay and continue same treatment with z pak as prescribed.

## 2012-10-24 ENCOUNTER — Other Ambulatory Visit: Payer: Self-pay | Admitting: Internal Medicine

## 2013-01-26 ENCOUNTER — Other Ambulatory Visit: Payer: Self-pay | Admitting: Internal Medicine

## 2013-01-27 NOTE — Telephone Encounter (Signed)
Patient needs to schedule a CPX  

## 2013-02-21 ENCOUNTER — Other Ambulatory Visit: Payer: Self-pay | Admitting: Internal Medicine

## 2013-02-26 ENCOUNTER — Telehealth: Payer: Self-pay | Admitting: General Practice

## 2013-02-26 MED ORDER — TRAMADOL HCL 50 MG PO TABS: ORAL_TABLET | ORAL | Status: DC

## 2013-02-26 NOTE — Telephone Encounter (Signed)
Requesting Tramadol 50mg  Last refill:12-25-12 Last OV:04-10-12 UDS:Not one on file Please advise.//AB/CMA

## 2013-02-26 NOTE — Telephone Encounter (Signed)
Pt pharmacy requesting a tramadol refill Last OV 10-16-12 (Dr. Eleanora Neighbor) Med last filled 10-24-12 #60 with 1 refill  Controlled substance contract on file

## 2013-02-26 NOTE — Telephone Encounter (Signed)
Med filled per [facility].

## 2013-02-26 NOTE — Telephone Encounter (Signed)
Addressed ; # 60 , R x1

## 2013-02-26 NOTE — Telephone Encounter (Signed)
Refill # 60, R X 1

## 2013-04-27 ENCOUNTER — Other Ambulatory Visit: Payer: Self-pay | Admitting: Internal Medicine

## 2013-04-28 NOTE — Telephone Encounter (Signed)
Benazepril refill sent to pharmacy. OV due

## 2013-06-18 ENCOUNTER — Other Ambulatory Visit: Payer: Self-pay | Admitting: Internal Medicine

## 2013-06-18 NOTE — Telephone Encounter (Signed)
traMADol (ULTRAM) 50 MG tablet Last refill: 02/26/13 #60, 1 refill Last OV: 04/10/2012 Needs UDS

## 2013-06-18 NOTE — Telephone Encounter (Signed)
OK X1 

## 2013-06-19 DIAGNOSIS — H348192 Central retinal vein occlusion, unspecified eye, stable: Secondary | ICD-10-CM

## 2013-06-19 HISTORY — DX: Central retinal vein occlusion, unspecified eye, stable: H34.8192

## 2013-07-02 ENCOUNTER — Telehealth: Payer: Self-pay | Admitting: *Deleted

## 2013-07-02 NOTE — Telephone Encounter (Signed)
PA paperwork for Amitriptyline HCL 10 mg tab faxed to insurance company. Awaiting response. JG//CMA

## 2013-07-04 NOTE — Telephone Encounter (Signed)
Amitriptyline HCL PA approved through 07/01/2014. GPI-NDC: [id number]. Original sent to be scanned. JG//CMA

## 2013-07-31 ENCOUNTER — Other Ambulatory Visit: Payer: Self-pay | Admitting: Internal Medicine

## 2013-08-04 NOTE — Telephone Encounter (Signed)
Rx sent to the pharmacy by e-script.  Pt needs complete physical.//AB/CMA 

## 2013-08-29 ENCOUNTER — Other Ambulatory Visit: Payer: Self-pay | Admitting: Internal Medicine

## 2013-09-30 ENCOUNTER — Telehealth: Payer: Self-pay

## 2013-09-30 NOTE — Telephone Encounter (Addendum)
Left message for call back Non-identifiable  CCS--Unknown BD- 04/23/12- osteopenia Flu- 03/29/13 Td- y-2 per daughter PNA- 09/21/11 Z- N/A patient has had the shingles  Chart reviewed with daughter Ladona Ridgel)  and she stated the patient has not had her Shingles vaccine but has had the disease. She is getting shots in her eyes for Retinal Vein Occulsion by Dr.Sanders. [facility] uses [facility], No new allergies. Chart updated.     KP

## 2013-10-01 ENCOUNTER — Encounter: Payer: Self-pay | Admitting: Family Medicine

## 2013-10-01 ENCOUNTER — Ambulatory Visit (INDEPENDENT_AMBULATORY_CARE_PROVIDER_SITE_OTHER): Payer: Medicare Other | Admitting: Family Medicine

## 2013-10-01 VITALS — BP 120/82 | HR 79 | Temp 98.2°F | Resp 16 | Ht 62.0 in | Wt 117.2 lb

## 2013-10-01 DIAGNOSIS — I10 Essential (primary) hypertension: Secondary | ICD-10-CM

## 2013-10-01 DIAGNOSIS — M810 Age-related osteoporosis without current pathological fracture: Secondary | ICD-10-CM

## 2013-10-01 DIAGNOSIS — E785 Hyperlipidemia, unspecified: Secondary | ICD-10-CM

## 2013-10-01 DIAGNOSIS — D234 Other benign neoplasm of skin of scalp and neck: Secondary | ICD-10-CM

## 2013-10-01 DIAGNOSIS — D224 Melanocytic nevi of scalp and neck: Secondary | ICD-10-CM

## 2013-10-01 NOTE — Assessment & Plan Note (Signed)
Chronic problem.  Well controlled.  Asymptomatic.  Check labs.  No anticipated med changes. 

## 2013-10-01 NOTE — Assessment & Plan Note (Signed)
Chronic problem.  Admits she has not been taking statin recently.  Check labs.  Restart meds prn.

## 2013-10-01 NOTE — Patient Instructions (Signed)
Schedule your complete physical in 6 months We'll notify you of your lab results and make any changes if needed We'll call you with your dermatology referral Call with any questions or concerns Welcome!  We're glad to have you! Happy Early Rudene Anda!!!

## 2013-10-01 NOTE — Progress Notes (Signed)
Pre visit review using our clinic review tool, if applicable. No additional management support is needed unless otherwise documented below in the visit note. 

## 2013-10-01 NOTE — Assessment & Plan Note (Signed)
Chronic problem.  Not currently on meds of daily Ca/Vit D.  Hx of Vit D deficiency- check level and replete prn.

## 2013-10-01 NOTE — Progress Notes (Signed)
   Subjective:    Patient ID: Kara Burnett, female    DOB: 25-Jan-1925, 80 y.o.   MRN: [id number]  HPI HTN- chronic problem, on Benazepril daily.  No CP, SOB, HAs, edema.  Hyperlipidemia- chronic problem, supposed to be on Crestor but has not been taking 'for a long time.  No abd pain, N/V.  Osteoporosis- chronic problem, not currently on medication.  Not on Calcium or Vit D.  UTD on DEXA.  Hx of Vit D deficiency.  L retinal vein occlusion- dx'd in November 2014.  Following w/ Dr Baird Cancer for retinal injxns w/ Avastin.   Review of Systems For ROS see HPI     Objective:   Physical Exam  Vitals reviewed. Constitutional: She is oriented to person, place, and time. She appears well-developed and well-nourished. No distress.  HENT:  Head: Normocephalic and atraumatic.  Eyes: Conjunctivae and EOM are normal. Pupils are equal, round, and reactive to light.  Neck: Normal range of motion. Neck supple. No thyromegaly present.  Cardiovascular: Normal rate, regular rhythm, normal heart sounds and intact distal pulses.   No murmur heard. Pulmonary/Chest: Effort normal and breath sounds normal. No respiratory distress.  Abdominal: Soft. She exhibits no distension. There is no tenderness.  Musculoskeletal: She exhibits no edema.  Lymphadenopathy:    She has no cervical adenopathy.  Neurological: She is alert and oriented to person, place, and time.  Skin: Skin is warm and dry.  Abnormal mole on R neck  Psychiatric: She has a normal mood and affect. Her behavior is normal.          Assessment & Plan:

## 2013-10-01 NOTE — Assessment & Plan Note (Signed)
New.  Refer to [facility]

## 2013-10-02 ENCOUNTER — Encounter: Payer: Self-pay | Admitting: General Practice

## 2013-10-02 ENCOUNTER — Other Ambulatory Visit: Payer: Self-pay | Admitting: General Practice

## 2013-10-02 ENCOUNTER — Telehealth: Payer: Self-pay | Admitting: Family Medicine

## 2013-10-02 LAB — CBC WITH DIFFERENTIAL/PLATELET
Basophils Absolute: 0 10*3/uL (ref 0.0–0.1)
Basophils Relative: 0.2 % (ref 0.0–3.0)
Eosinophils Absolute: 0 10*3/uL (ref 0.0–0.7)
Eosinophils Relative: 0.5 % (ref 0.0–5.0)
HEMATOCRIT: 36.6 % (ref 36.0–46.0)
HEMOGLOBIN: 12.2 g/dL (ref 12.0–15.0)
LYMPHS ABS: 1.8 10*3/uL (ref 0.7–4.0)
Lymphocytes Relative: 32.1 % (ref 12.0–46.0)
MCHC: 33.2 g/dL (ref 30.0–36.0)
MCV: 96.8 fl (ref 78.0–100.0)
MONOS PCT: 3.4 % (ref 3.0–12.0)
Monocytes Absolute: 0.2 10*3/uL (ref 0.1–1.0)
Neutro Abs: 3.6 10*3/uL (ref 1.4–7.7)
Neutrophils Relative %: 63.8 % (ref 43.0–77.0)
PLATELETS: 337 10*3/uL (ref 150.0–400.0)
RBC: 3.78 Mil/uL — ABNORMAL LOW (ref 3.87–5.11)
RDW: 14 % (ref 11.5–14.6)
WBC: 5.7 10*3/uL (ref 4.5–10.5)

## 2013-10-02 LAB — LIPID PANEL
CHOLESTEROL: 246 mg/dL — AB (ref 0–200)
HDL: 56.5 mg/dL (ref >39.00)
LDL Cholesterol: 176 mg/dL — ABNORMAL HIGH (ref 0–99)
Total CHOL/HDL Ratio: 4
Triglycerides: 67 mg/dL (ref 0.0–149.0)
VLDL: 13.4 mg/dL (ref 0.0–40.0)

## 2013-10-02 LAB — HEPATIC FUNCTION PANEL
ALBUMIN: 3.6 g/dL (ref 3.5–5.2)
ALT: 11 U/L (ref 0–35)
AST: 20 U/L (ref 0–37)
Alkaline Phosphatase: 41 U/L (ref 39–117)
Bilirubin, Direct: 0.1 mg/dL (ref 0.0–0.3)
Total Bilirubin: 0.7 mg/dL (ref 0.3–1.2)
Total Protein: 6.5 g/dL (ref 6.0–8.3)

## 2013-10-02 LAB — BASIC METABOLIC PANEL
BUN: 19 mg/dL (ref 6–23)
CALCIUM: 9.8 mg/dL (ref 8.4–10.5)
CO2: 30 meq/L (ref 19–32)
CREATININE: 1 mg/dL (ref 0.4–1.2)
Chloride: 100 mEq/L (ref 96–112)
GFR: 53.28 mL/min — AB (ref >60.00)
GLUCOSE: 79 mg/dL (ref 70–99)
Potassium: 4.4 mEq/L (ref 3.5–5.1)
SODIUM: 137 meq/L (ref 135–145)

## 2013-10-02 LAB — TSH: TSH: 1.06 u[IU]/mL (ref 0.35–5.50)

## 2013-10-02 MED ORDER — ROSUVASTATIN CALCIUM 20 MG PO TABS: ORAL_TABLET | ORAL | Status: DC

## 2013-10-02 NOTE — Telephone Encounter (Signed)
Relevant patient education assigned to patient using Emmi. ° °

## 2013-10-07 LAB — VITAMIN D 1,25 DIHYDROXY
VITAMIN D 1, 25 (OH) TOTAL: 34 pg/mL (ref 18–72)
Vitamin D2 1, 25 (OH)2: 18 pg/mL
Vitamin D3 1, 25 (OH)2: 16 pg/mL

## 2013-10-08 ENCOUNTER — Encounter: Payer: Self-pay | Admitting: General Practice

## 2013-10-15 ENCOUNTER — Other Ambulatory Visit: Payer: Self-pay | Admitting: Family Medicine

## 2013-10-15 NOTE — Telephone Encounter (Signed)
Med filled and faxed.  

## 2013-10-15 NOTE — Telephone Encounter (Signed)
Last ov 10-01-13 Med filled 06-18-13 #60 with 1

## 2013-10-29 ENCOUNTER — Other Ambulatory Visit: Payer: Self-pay | Admitting: Dermatology

## 2013-11-03 ENCOUNTER — Other Ambulatory Visit: Payer: Self-pay | Admitting: Internal Medicine

## 2013-11-05 ENCOUNTER — Other Ambulatory Visit: Payer: Self-pay | Admitting: Internal Medicine

## 2013-11-06 ENCOUNTER — Telehealth: Payer: Self-pay | Admitting: Family Medicine

## 2013-11-06 MED ORDER — BENAZEPRIL HCL 10 MG PO TABS: ORAL_TABLET | ORAL | Status: DC

## 2013-11-06 NOTE — Telephone Encounter (Signed)
Med filled will notify pt.

## 2013-11-06 NOTE — Telephone Encounter (Signed)
Caller name: Teara Relation to pt: self Call back number: [phone] [id number] [place], [address]   Reason for call:  Pt is needing the RX benazepril (LOTENSIN) 10 MG tablet filled soon. Pt is down to last pill and needs soon. thanks

## 2013-11-07 ENCOUNTER — Other Ambulatory Visit: Payer: Self-pay | Admitting: Family Medicine

## 2013-11-07 NOTE — Telephone Encounter (Signed)
Med filled.  

## 2014-02-04 ENCOUNTER — Other Ambulatory Visit: Payer: Self-pay | Admitting: Family Medicine

## 2014-02-04 NOTE — Telephone Encounter (Signed)
Last OV 10-01-13 Med filled 10-15-13 #60 with 1

## 2014-02-05 NOTE — Telephone Encounter (Signed)
Med filled and faxed.  

## 2014-04-03 ENCOUNTER — Ambulatory Visit (INDEPENDENT_AMBULATORY_CARE_PROVIDER_SITE_OTHER): Payer: Medicare Other | Admitting: Family Medicine

## 2014-04-03 ENCOUNTER — Encounter: Payer: Self-pay | Admitting: Family Medicine

## 2014-04-03 VITALS — BP 140/80 | HR 57 | Temp 98.3°F | Resp 17 | Ht 61.0 in | Wt 110.0 lb

## 2014-04-03 DIAGNOSIS — F329 Major depressive disorder, single episode, unspecified: Secondary | ICD-10-CM

## 2014-04-03 DIAGNOSIS — Z23 Encounter for immunization: Secondary | ICD-10-CM

## 2014-04-03 DIAGNOSIS — M810 Age-related osteoporosis without current pathological fracture: Secondary | ICD-10-CM

## 2014-04-03 DIAGNOSIS — F32A Depression, unspecified: Secondary | ICD-10-CM

## 2014-04-03 DIAGNOSIS — E785 Hyperlipidemia, unspecified: Secondary | ICD-10-CM

## 2014-04-03 DIAGNOSIS — I10 Essential (primary) hypertension: Secondary | ICD-10-CM

## 2014-04-03 DIAGNOSIS — E559 Vitamin D deficiency, unspecified: Secondary | ICD-10-CM

## 2014-04-03 DIAGNOSIS — Z0000 Encounter for general adult medical examination without abnormal findings: Secondary | ICD-10-CM

## 2014-04-03 DIAGNOSIS — Z1231 Encounter for screening mammogram for malignant neoplasm of breast: Secondary | ICD-10-CM

## 2014-04-03 LAB — CBC WITH DIFFERENTIAL/PLATELET
Basophils Absolute: 0 10*3/uL (ref 0.0–0.1)
Basophils Relative: 0 % (ref 0–1)
EOS ABS: 0.1 10*3/uL (ref 0.0–0.7)
Eosinophils Relative: 1 % (ref 0–5)
HCT: 35.9 % — ABNORMAL LOW (ref 36.0–46.0)
HEMOGLOBIN: 12.3 g/dL (ref 12.0–15.0)
LYMPHS ABS: 1.9 10*3/uL (ref 0.7–4.0)
Lymphocytes Relative: 29 % (ref 12–46)
MCH: 32.5 pg (ref 26.0–34.0)
MCHC: 34.3 g/dL (ref 30.0–36.0)
MCV: 94.7 fL (ref 78.0–100.0)
MONOS PCT: 7 % (ref 3–12)
Monocytes Absolute: 0.5 10*3/uL (ref 0.1–1.0)
Neutro Abs: 4.1 10*3/uL (ref 1.7–7.7)
Neutrophils Relative %: 63 % (ref 43–77)
Platelets: 301 10*3/uL (ref 150–400)
RBC: 3.79 MIL/uL — AB (ref 3.87–5.11)
RDW: 14 % (ref 11.5–15.5)
WBC: 6.5 10*3/uL (ref 4.0–10.5)

## 2014-04-03 LAB — BASIC METABOLIC PANEL
BUN: 14 mg/dL (ref 6–23)
CALCIUM: 10 mg/dL (ref 8.4–10.5)
CO2: 28 mEq/L (ref 19–32)
Chloride: 102 mEq/L (ref 96–112)
Creat: 1.01 mg/dL (ref 0.50–1.10)
GLUCOSE: 85 mg/dL (ref 70–99)
Potassium: 4.5 mEq/L (ref 3.5–5.3)
Sodium: 140 mEq/L (ref 135–145)

## 2014-04-03 LAB — HEPATIC FUNCTION PANEL
ALT: 10 U/L (ref 0–35)
AST: 19 U/L (ref 0–37)
Albumin: 4 g/dL (ref 3.5–5.2)
Alkaline Phosphatase: 51 U/L (ref 39–117)
BILIRUBIN INDIRECT: 0.7 mg/dL (ref 0.2–1.2)
BILIRUBIN TOTAL: 0.8 mg/dL (ref 0.2–1.2)
Bilirubin, Direct: 0.1 mg/dL (ref 0.0–0.3)
TOTAL PROTEIN: 6.4 g/dL (ref 6.0–8.3)

## 2014-04-03 LAB — LIPID PANEL
Cholesterol: 250 mg/dL — ABNORMAL HIGH (ref 0–200)
HDL: 64 mg/dL (ref >39)
LDL CALC: 169 mg/dL — AB (ref 0–99)
Total CHOL/HDL Ratio: 3.9 Ratio
Triglycerides: 87 mg/dL (ref <150)
VLDL: 17 mg/dL (ref 0–40)

## 2014-04-03 LAB — TSH: TSH: 2.011 u[IU]/mL (ref 0.350–4.500)

## 2014-04-03 MED ORDER — SERTRALINE HCL 25 MG PO TABS: 25.0000 mg | ORAL_TABLET | Freq: Every day | ORAL | Status: DC

## 2014-04-03 NOTE — Progress Notes (Signed)
   Subjective:    Patient ID: Kara Burnett, female    DOB: 1924/07/28, 80 y.o.   MRN: [id number]  HPI Here today for CPE.  Risk Factors: HTN- chronic problem, adequate control, on Benazepril.  Hyperlipidemia- chronic problem, on Crestor.  Osteoporosis- hx of Vit D deficiency, due for DEXA in Nov.  Due for mammo.  UTD on colonoscopy.  No need for paps.  Urinary incontinence- stress incontinence, occurs w/ lifting.  Urge incontinence.  Drinks a lot of caffeine.   Physical Activity: limited exercise Fall Risk: low risk Depression: pt reports frequent sadness Hearing: normal to conversational tones w/ hearing aides, decreased to whispered voice ADL's: independent Cognitive: normal linear thought process, memory and attention intact Home Safety: safe at home, lives alone, has alarm sister, 3 children close by. Height, Weight, BMI, Visual Acuity: see vitals, vision corrected to 20/20 w/ glasses Counseling: UTD on colonoscopy, due for dexa and mammo.  No need for paps. Labs Ordered: See A&P Care Plan: See A&P    Review of Systems Patient reports no vision/ hearing changes, adenopathy,fever, weight change,  persistant/recurrent hoarseness , swallowing issues, chest pain, palpitations, edema, persistant/recurrent cough, hemoptysis, dyspnea (rest/exertional/paroxysmal nocturnal), gastrointestinal bleeding (melena, rectal bleeding), abdominal pain, significant heartburn, bowel changes, GU symptoms (dysuria, hematuria, incontinence), Gyn symptoms (abnormal  bleeding, pain),  syncope, focal weakness, memory loss, numbness & tingling, skin/hair/nail changes, abnormal bruising or bleeding.     Objective:   Physical Exam  General Appearance:    Alert, cooperative, no distress, appears stated age  Head:    Normocephalic, without obvious abnormality, atraumatic  Eyes:    PERRL, conjunctiva/corneas clear, EOM's intact, fundi    benign, both eyes  Ears:    Normal TM's and external ear canals,  both ears  Nose:   Nares normal, septum midline, mucosa normal, no drainage    or sinus tenderness  Throat:   Lips, mucosa, and tongue normal; teeth and gums normal  Neck:   Supple, symmetrical, trachea midline, no adenopathy;    Thyroid: no enlargement/tenderness/nodules  Back:     Symmetric, no curvature, ROM normal, no CVA tenderness  Lungs:     Clear to auscultation bilaterally, respirations unlabored  Chest Wall:    No tenderness or deformity   Heart:    Regular rate and rhythm, S1 and S2 normal, no murmur, rub   or gallop  Breast Exam:    Deferred to mammo  Abdomen:     Soft, non-tender, bowel sounds active all four quadrants,    no masses, no organomegaly  Genitalia:    Deferred at pt's request  Rectal:    Extremities:   Extremities normal, atraumatic, no cyanosis or edema  Pulses:   2+ and symmetric all extremities  Skin:   Skin color, texture, turgor normal, no rashes or lesions  Lymph nodes:   Cervical, supraclavicular, and axillary nodes normal  Neurologic:   CNII-XII intact, normal strength, sensation and reflexes    throughout          Assessment & Plan:

## 2014-04-03 NOTE — Patient Instructions (Signed)
Follow up in 6-8 weeks to recheck mood Start the Zoloft daily We'll notify you of your lab results and make any changes if needed We'll call you with your mammo and bone density appts Call with any questions or concerns Keep up the good work!  You look great!

## 2014-04-03 NOTE — Progress Notes (Signed)
Pre visit review using our clinic review tool, if applicable. No additional management support is needed unless otherwise documented below in the visit note. 

## 2014-04-05 NOTE — Assessment & Plan Note (Signed)
Check labs.  Replete prn. 

## 2014-04-05 NOTE — Assessment & Plan Note (Signed)
Chronic problem.  Tolerating statin w/o difficulty.  Check labs.  Adjust meds prn  

## 2014-04-05 NOTE — Assessment & Plan Note (Signed)
Chronic problem for pt.  Deteriorated recently.  Start low dose Zoloft and monitor for symptom improvement.

## 2014-04-05 NOTE — Assessment & Plan Note (Signed)
Chronic problem.  Due for DEXA.  Order entered.  Check Vit D level, replete prn.

## 2014-04-05 NOTE — Assessment & Plan Note (Signed)
Chronic problem.  Adequate control.  Asymptomatic.  Check labs.  No anticipated changes.

## 2014-04-06 ENCOUNTER — Encounter: Payer: Self-pay | Admitting: General Practice

## 2014-04-17 ENCOUNTER — Ambulatory Visit (INDEPENDENT_AMBULATORY_CARE_PROVIDER_SITE_OTHER): Payer: Medicare Other | Admitting: Family Medicine

## 2014-04-17 ENCOUNTER — Encounter: Payer: Self-pay | Admitting: Family Medicine

## 2014-04-17 ENCOUNTER — Ambulatory Visit (HOSPITAL_BASED_OUTPATIENT_CLINIC_OR_DEPARTMENT_OTHER)
Admission: RE | Admit: 2014-04-17 | Discharge: 2014-04-17 | Disposition: A | Discharge status: Home / Self Care | Payer: Medicare Other | Source: Ambulatory Visit | Attending: Family Medicine | Admitting: Family Medicine

## 2014-04-17 VITALS — BP 124/59 | HR 82 | Temp 98.9°F | Ht 62.0 in | Wt 106.2 lb

## 2014-04-17 DIAGNOSIS — K449 Diaphragmatic hernia without obstruction or gangrene: Secondary | ICD-10-CM | POA: Diagnosis not present

## 2014-04-17 DIAGNOSIS — J209 Acute bronchitis, unspecified: Secondary | ICD-10-CM

## 2014-04-17 DIAGNOSIS — R32 Unspecified urinary incontinence: Secondary | ICD-10-CM

## 2014-04-17 DIAGNOSIS — R05 Cough: Secondary | ICD-10-CM | POA: Diagnosis present

## 2014-04-17 DIAGNOSIS — I10 Essential (primary) hypertension: Secondary | ICD-10-CM

## 2014-04-17 DIAGNOSIS — R0989 Other specified symptoms and signs involving the circulatory and respiratory systems: Secondary | ICD-10-CM | POA: Insufficient documentation

## 2014-04-17 MED ORDER — CEFDINIR 300 MG PO CAPS: 300.0000 mg | ORAL_CAPSULE | Freq: Two times a day (BID) | ORAL | Status: AC

## 2014-04-17 MED ORDER — CEFDINIR 300 MG PO CAPS: 300.0000 mg | ORAL_CAPSULE | Freq: Two times a day (BID) | ORAL | Status: DC

## 2014-04-17 NOTE — Progress Notes (Signed)
Pre visit review using our clinic review tool, if applicable. No additional management support is needed unless otherwise documented below in the visit note. 

## 2014-04-17 NOTE — Patient Instructions (Addendum)
Encouraged increased rest and hydration, add probiotics such as Digestive Advantage or Phillip's Colon Health or generic   Acute Bronchitis Bronchitis is inflammation of the airways that extend from the windpipe into the lungs (bronchi). The inflammation often causes mucus to develop. This leads to a cough, which is the most common symptom of bronchitis.  In acute bronchitis, the condition usually develops suddenly and goes away over time, usually in a couple weeks. Smoking, allergies, and asthma can make bronchitis worse. Repeated episodes of bronchitis may cause further lung problems.  CAUSES Acute bronchitis is most often caused by the same virus that causes a cold. The virus can spread from person to person (contagious) through coughing, sneezing, and touching contaminated objects. SIGNS AND SYMPTOMS   Cough.   Fever.   Coughing up mucus.   Body aches.   Chest congestion.   Chills.   Shortness of breath.   Sore throat.  DIAGNOSIS  Acute bronchitis is usually diagnosed through a physical exam. Your health care provider will also ask you questions about your medical history. Tests, such as chest X-rays, are sometimes done to rule out other conditions.  TREATMENT  Acute bronchitis usually goes away in a couple weeks. Oftentimes, no medical treatment is necessary. Medicines are sometimes given for relief of fever or cough. Antibiotic medicines are usually not needed but may be prescribed in certain situations. In some cases, an inhaler may be recommended to help reduce shortness of breath and control the cough. A cool mist vaporizer may also be used to help thin bronchial secretions and make it easier to clear the chest.  HOME CARE INSTRUCTIONS  Get plenty of rest.   Drink enough fluids to keep your urine clear or pale yellow (unless you have a medical condition that requires fluid restriction). Increasing fluids may help thin your respiratory secretions (sputum) and reduce  chest congestion, and it will prevent dehydration.   Take medicines only as directed by your health care provider.  If you were prescribed an antibiotic medicine, finish it all even if you start to feel better.  Avoid smoking and secondhand smoke. Exposure to cigarette smoke or irritating chemicals will make bronchitis worse. If you are a smoker, consider using nicotine gum or skin patches to help control withdrawal symptoms. Quitting smoking will help your lungs heal faster.   Reduce the chances of another bout of acute bronchitis by washing your hands frequently, avoiding people with cold symptoms, and trying not to touch your hands to your mouth, nose, or eyes.   Keep all follow-up visits as directed by your health care provider.  SEEK MEDICAL CARE IF: Your symptoms do not improve after 1 week of treatment.  SEEK IMMEDIATE MEDICAL CARE IF:  You develop an increased fever or chills.   You have chest pain.   You have severe shortness of breath.  You have bloody sputum.   You develop dehydration.  You faint or repeatedly feel like you are going to pass out.  You develop repeated vomiting.  You develop a severe headache. MAKE SURE YOU:   Understand these instructions.  Will watch your condition.  Will get help right away if you are not doing well or get worse. Document Released: 07/13/2004 Document Revised: 10/20/2013 Document Reviewed: 11/26/2012 [facility] Patient Information 2015 [place], [place]. This information is not intended to replace advice given to you by your health care provider. Make sure you discuss any questions you have with your health care provider. , zinc such as Coldeze  or Xicam. Treat fevers as needed Mucinex/Guafenesin

## 2014-04-18 ENCOUNTER — Encounter: Payer: Self-pay | Admitting: Family Medicine

## 2014-04-18 DIAGNOSIS — J209 Acute bronchitis, unspecified: Secondary | ICD-10-CM | POA: Insufficient documentation

## 2014-04-18 DIAGNOSIS — R32 Unspecified urinary incontinence: Secondary | ICD-10-CM | POA: Insufficient documentation

## 2014-04-18 LAB — CBC WITH DIFFERENTIAL/PLATELET
BASOS ABS: 0 10*3/uL (ref 0.0–0.1)
BASOS PCT: 0 % (ref 0–1)
Eosinophils Absolute: 0 10*3/uL (ref 0.0–0.7)
Eosinophils Relative: 0 % (ref 0–5)
HCT: 36.9 % (ref 36.0–46.0)
HEMOGLOBIN: 12.9 g/dL (ref 12.0–15.0)
Lymphocytes Relative: 11 % — ABNORMAL LOW (ref 12–46)
Lymphs Abs: 1.5 10*3/uL (ref 0.7–4.0)
MCH: 32.2 pg (ref 26.0–34.0)
MCHC: 35 g/dL (ref 30.0–36.0)
MCV: 92 fL (ref 78.0–100.0)
MONO ABS: 0.9 10*3/uL (ref 0.1–1.0)
Monocytes Relative: 7 % (ref 3–12)
NEUTROS ABS: 10.8 10*3/uL — AB (ref 1.7–7.7)
NEUTROS PCT: 82 % — AB (ref 43–77)
Platelets: 299 10*3/uL (ref 150–400)
RBC: 4.01 MIL/uL (ref 3.87–5.11)
RDW: 13.4 % (ref 11.5–15.5)
WBC: 13.2 10*3/uL — ABNORMAL HIGH (ref 4.0–10.5)

## 2014-04-18 LAB — URINALYSIS
GLUCOSE, UA: NEGATIVE mg/dL
Nitrite: NEGATIVE
Protein, ur: 30 mg/dL — AB
Specific Gravity, Urine: 1.016 (ref 1.005–1.030)
Urobilinogen, UA: 1 mg/dL (ref 0.0–1.0)
pH: 5.5 (ref 5.0–8.0)

## 2014-04-18 NOTE — Assessment & Plan Note (Signed)
Check UA and culture 

## 2014-04-18 NOTE — Assessment & Plan Note (Signed)
Well controlled, no changes to meds. Encouraged heart healthy diet such as the DASH diet and exercise as tolerated.  °

## 2014-04-18 NOTE — Assessment & Plan Note (Signed)
cxr negative for pneumonia, started on mucinex and antibiotics. Return if not improving

## 2014-04-18 NOTE — Progress Notes (Signed)
Patient ID: Kara Burnett, female   DOB: [birth year], 80 y.o.   MRN: [id number] JOYLYNN DEFRANCESCO [id number] 01-Mar-1925 04/18/2014      Progress Note-Follow Up  Subjective  Chief Complaint  Chief Complaint  Patient presents with  . Cough    X 1 day- green phlegm  . Sore Throat    X 1 day    HPI  Patient is a 80 year old female in today for routine medical care. In today with her daughter with a 1 day history of sore throat, malaise, cough and malaise, her cough is productive of green phlegm. Nausea and PND is noted. She reports symptoms started after she took her flu shot yesterday. Denies CP/palp/SOB/fevers/GI or GU c/o. Taking meds as prescribed  Past Medical History  Diagnosis Date  . Hypertension   . Hyperlipidemia   . GERD (gastroesophageal reflux disease)   . Anemia   . Osteoporosis   . Herpes zoster   . Retinal vein occlusion 2015    Dr.Sanders    Past Surgical History  Procedure Laterality Date  . Cholecystectomy  1990  . Sacroplasty      post pelvic fracture  . Colonoscopy  2008    negative  . Cataract surgery      Bilaterally    Family History  Problem Relation Age of Onset  . Stroke Father   . Heart disease Father     MI in 24s  . Heart disease Sister     MI in 6s  . Diabetes Sister   . Stroke Mother     coma  . Hypothyroidism Son   . Heart disease Son     History   Social History  . Marital Status: Widowed    Spouse Name: N/A    Number of Children: N/A  . Years of Education: N/A   Occupational History  . Not on file.   Social History Main Topics  . Smoking status: Never Smoker   . Smokeless tobacco: Not on file  . Alcohol Use: No  . Drug Use: No  . Sexual Activity: Not on file   Other Topics Concern  . Not on file   Social History Narrative  . No narrative on file    Current Outpatient Prescriptions on File Prior to Visit  Medication Sig Dispense Refill  . amitriptyline (ELAVIL) 10 MG tablet TAKE 1 TABLET (10 MG TOTAL)  BY MOUTH AT BEDTIME.  30 tablet  0  . aspirin 81 MG tablet Take 81 mg by mouth daily.      . benazepril (LOTENSIN) 10 MG tablet TAKE 1 TABLET BY MOUTH DAILY  30 tablet  1  . bevacizumab (AVASTIN) 1.25 mg/0.1 mL SOLN 1.25 mg by Intravitreal route every 30 (thirty) days.      . rosuvastatin (CRESTOR) 20 MG tablet TAKE 1 TABLET EVERY DAY  30 tablet  4  . sertraline (ZOLOFT) 25 MG tablet Take 1 tablet (25 mg total) by mouth daily.  30 tablet  3  . traMADol (ULTRAM) 50 MG tablet TAKE 1 TO 2 TABLETS EVERY 6 HOURS AS NEEDED FOR PAIN  60 tablet  1   No current facility-administered medications on file prior to visit.    Allergies  Allergen Reactions  . Ciprofloxacin     Nausea & vomiting     Review of Systems  Review of Systems  Constitutional: Positive for malaise/fatigue. Negative for fever.  HENT: Positive for congestion and sore throat.   Eyes: Negative for  discharge.  Respiratory: Positive for cough. Negative for shortness of breath.   Cardiovascular: Negative for chest pain, palpitations and leg swelling.  Gastrointestinal: Positive for nausea. Negative for vomiting, abdominal pain and diarrhea.  Genitourinary: Negative for dysuria.  Musculoskeletal: Negative for falls.  Skin: Negative for rash.  Neurological: Positive for headaches. Negative for loss of consciousness.  Endo/Heme/Allergies: Negative for polydipsia.  Psychiatric/Behavioral: Negative for depression and suicidal ideas. The patient is not nervous/anxious and does not have insomnia.     Objective  BP 124/59  Pulse 82  Temp(Src) 98.9 F (37.2 C) (Oral)  Ht 5\' 2"  (1.575 m)  Wt 106 lb 3.2 oz (48.172 kg)  BMI 19.42 kg/m2  SpO2 96%  Physical Exam  Physical Exam  Constitutional: She is oriented to person, place, and time and well-developed, well-nourished, and in no distress. No distress.  frail  HENT:  Head: Normocephalic and atraumatic.  Eyes: Conjunctivae are normal.  Neck: Neck supple. No thyromegaly  present.  Cardiovascular: Normal rate, regular rhythm and normal heart sounds.   No murmur heard. Pulmonary/Chest: Effort normal and breath sounds normal. She has no wheezes.  Rhonchi lll  Abdominal: She exhibits no distension and no mass.  Musculoskeletal: She exhibits no edema.  Lymphadenopathy:    She has no cervical adenopathy.  Neurological: She is alert and oriented to person, place, and time.  Skin: Skin is warm and dry. No rash noted. She is not diaphoretic.  Psychiatric: Memory, affect and judgment normal.    Lab Results  Component Value Date   TSH 2.011 04/03/2014   Lab Results  Component Value Date   WBC 6.5 04/03/2014   HGB 12.3 04/03/2014   HCT 35.9* 04/03/2014   MCV 94.7 04/03/2014   PLT 301 04/03/2014   Lab Results  Component Value Date   CREATININE 1.01 04/03/2014   BUN 14 04/03/2014   NA 140 04/03/2014   K 4.5 04/03/2014   CL 102 04/03/2014   CO2 28 04/03/2014   Lab Results  Component Value Date   ALT 10 04/03/2014   AST 19 04/03/2014   ALKPHOS 51 04/03/2014   BILITOT 0.8 04/03/2014   Lab Results  Component Value Date   CHOL 250* 04/03/2014   Lab Results  Component Value Date   HDL 64 04/03/2014   Lab Results  Component Value Date   LDLCALC 169* 04/03/2014   Lab Results  Component Value Date   TRIG 87 04/03/2014   Lab Results  Component Value Date   CHOLHDL 3.9 04/03/2014     Assessment & Plan  Essential hypertension Well controlled, no changes to meds. Encouraged heart healthy diet such as the DASH diet and exercise as tolerated.   Absence of bladder continence Check UA and culture  Acute bronchitis cxr negative for pneumonia, started on mucinex and antibiotics. Return if not improving

## 2014-04-20 LAB — URINE CULTURE

## 2014-05-18 ENCOUNTER — Telehealth: Payer: Self-pay | Admitting: Family Medicine

## 2014-05-18 ENCOUNTER — Other Ambulatory Visit: Payer: Self-pay | Admitting: General Practice

## 2014-05-18 MED ORDER — AMITRIPTYLINE HCL 10 MG PO TABS: ORAL_TABLET | ORAL | Status: DC

## 2014-05-18 NOTE — Telephone Encounter (Signed)
amitriptyline (ELAVIL) 10 MG tablet was to be called into [facility] [address]

## 2014-05-18 NOTE — Telephone Encounter (Signed)
Med filled.  

## 2014-06-02 ENCOUNTER — Telehealth: Payer: Self-pay | Admitting: General Practice

## 2014-06-02 MED ORDER — TRAMADOL HCL 50 MG PO TABS: 50.0000 mg | ORAL_TABLET | Freq: Four times a day (QID) | ORAL | Status: DC | PRN

## 2014-06-02 NOTE — Telephone Encounter (Signed)
Med filled and faxed.  

## 2014-06-02 NOTE — Telephone Encounter (Signed)
Ok for #60 

## 2014-06-02 NOTE — Telephone Encounter (Signed)
Last OV 02-04-14 Tramadol last filled 02-04-14 # 60 with 1

## 2014-06-03 ENCOUNTER — Ambulatory Visit: Payer: Medicare Other | Admitting: Family Medicine

## 2014-06-17 ENCOUNTER — Ambulatory Visit: Payer: Medicare Other | Admitting: Family Medicine

## 2014-07-21 ENCOUNTER — Telehealth: Payer: Self-pay | Admitting: General Practice

## 2014-07-21 MED ORDER — TRAMADOL HCL 50 MG PO TABS: 50.0000 mg | ORAL_TABLET | Freq: Four times a day (QID) | ORAL | Status: DC | PRN

## 2014-07-21 NOTE — Telephone Encounter (Signed)
Last OV 10-01-13 Tramadol last filled 05-23-14 #60 with 0

## 2014-07-21 NOTE — Telephone Encounter (Signed)
Med filled.  

## 2014-07-21 NOTE — Telephone Encounter (Signed)
Ok for #60 

## 2014-08-03 ENCOUNTER — Telehealth: Payer: Self-pay | Admitting: General Practice

## 2014-08-03 ENCOUNTER — Telehealth: Payer: Self-pay | Admitting: Family Medicine

## 2014-08-03 MED ORDER — TRAMADOL HCL 50 MG PO TABS: 50.0000 mg | ORAL_TABLET | Freq: Every day | ORAL | Status: DC

## 2014-08-03 NOTE — Telephone Encounter (Signed)
Last OV 04-03-14 Tramadol was last filled on 07/23/14 #60 with 0. Per Pharmacy Spoke with pt and she advises that she only has 8 tablets left.   SIG states 1-2 tablets by mouth every 6 hours as needed for pain.

## 2014-08-03 NOTE — Telephone Encounter (Signed)
Caller name:Joydan Mesenbrink Relationship to patient:self Can be [phone]   Reason for call: PT mistakenly called into pharmacy to refill her RX- previous phone notes show that RX filled- PT requesting to speak with Janett Billow regarding mistake.

## 2014-08-03 NOTE — Telephone Encounter (Signed)
Will hold onto rx until pt needs.

## 2014-08-03 NOTE — Addendum Note (Signed)
Addended by: Kris Hartmann on: 08/03/2014 12:13 PM   Modules accepted: Orders

## 2014-08-03 NOTE — Telephone Encounter (Signed)
Please change sig to 1 tab QHS for RLS.  #30, no refills.  Pt needs to pay close attention to pill counts.

## 2014-08-03 NOTE — Telephone Encounter (Signed)
Med filled and pt notified.  

## 2014-08-03 NOTE — Telephone Encounter (Signed)
Could not reach pt by phone.

## 2014-08-07 DIAGNOSIS — H34812 Central retinal vein occlusion, left eye: Secondary | ICD-10-CM | POA: Diagnosis not present

## 2014-08-07 DIAGNOSIS — H43813 Vitreous degeneration, bilateral: Secondary | ICD-10-CM | POA: Diagnosis not present

## 2014-08-07 DIAGNOSIS — H3581 Retinal edema: Secondary | ICD-10-CM | POA: Diagnosis not present

## 2014-08-25 DIAGNOSIS — H3581 Retinal edema: Secondary | ICD-10-CM | POA: Diagnosis not present

## 2014-08-25 DIAGNOSIS — H34812 Central retinal vein occlusion, left eye: Secondary | ICD-10-CM | POA: Diagnosis not present

## 2014-10-07 ENCOUNTER — Telehealth: Payer: Self-pay | Admitting: General Practice

## 2014-10-07 MED ORDER — TRAMADOL HCL 50 MG PO TABS: 50.0000 mg | ORAL_TABLET | Freq: Every day | ORAL | Status: DC

## 2014-10-07 NOTE — Telephone Encounter (Signed)
Last OV 04/03/14 Tramadol last filled 08/03/14 #30 with 0

## 2014-10-07 NOTE — Telephone Encounter (Signed)
This medication was phoned in on 10/07/14.

## 2014-10-07 NOTE — Telephone Encounter (Signed)
Ok for #30 

## 2014-10-20 ENCOUNTER — Other Ambulatory Visit: Payer: Self-pay | Admitting: General Practice

## 2014-10-20 MED ORDER — BENAZEPRIL HCL 10 MG PO TABS: 10.0000 mg | ORAL_TABLET | Freq: Every day | ORAL | Status: DC

## 2014-10-28 ENCOUNTER — Encounter: Payer: Self-pay | Admitting: Family Medicine

## 2014-10-28 ENCOUNTER — Ambulatory Visit (INDEPENDENT_AMBULATORY_CARE_PROVIDER_SITE_OTHER): Payer: Medicare Other | Admitting: Family Medicine

## 2014-10-28 VITALS — BP 122/68 | HR 62 | Temp 97.9°F | Resp 16 | Wt 104.0 lb

## 2014-10-28 DIAGNOSIS — I10 Essential (primary) hypertension: Secondary | ICD-10-CM

## 2014-10-28 DIAGNOSIS — F32A Depression, unspecified: Secondary | ICD-10-CM

## 2014-10-28 DIAGNOSIS — E785 Hyperlipidemia, unspecified: Secondary | ICD-10-CM | POA: Diagnosis not present

## 2014-10-28 DIAGNOSIS — F329 Major depressive disorder, single episode, unspecified: Secondary | ICD-10-CM

## 2014-10-28 MED ORDER — AMITRIPTYLINE HCL 10 MG PO TABS: ORAL_TABLET | ORAL | Status: DC

## 2014-10-28 MED ORDER — SERTRALINE HCL 25 MG PO TABS: 25.0000 mg | ORAL_TABLET | Freq: Every day | ORAL | Status: DC

## 2014-10-28 MED ORDER — BENAZEPRIL HCL 10 MG PO TABS
10.0000 mg | ORAL_TABLET | Freq: Every day | ORAL | Status: DC
Start: 2014-10-28 — End: 2015-09-29

## 2014-10-28 MED ORDER — ROSUVASTATIN CALCIUM 20 MG PO TABS: ORAL_TABLET | ORAL | Status: DC

## 2014-10-28 NOTE — Progress Notes (Signed)
Pre visit review using our clinic review tool, if applicable. No additional management support is needed unless otherwise documented below in the visit note. 

## 2014-10-28 NOTE — Progress Notes (Signed)
   Subjective:    Patient ID: Kara Burnett, female    DOB: [birth year], 81 y.o.   MRN: [id number]  HPI HTN- chronic problem, pt is on Benazepril.  Pt has not been taking meds regularly until recently.  BP is well controlled today.  Denies CP, SOB, HAs, visual changes, edema.  Hyperlipidemia- chronic problem, daughter has been doing her medication for the last month but prior to that, pt was attempting to do her own medication.  Denies abd pain, N/V, myalgias.  Depression- chronic problem, currently on Zoloft and Elavil.  Pt has not been taking meds regularly   Review of Systems For ROS see HPI     Objective:   Physical Exam  Constitutional: She is oriented to person, place, and time. She appears well-developed and well-nourished. No distress.  HENT:  Head: Normocephalic and atraumatic.  Eyes: Conjunctivae and EOM are normal. Pupils are equal, round, and reactive to light.  Neck: Normal range of motion. Neck supple. No thyromegaly present.  Cardiovascular: Normal rate, regular rhythm, normal heart sounds and intact distal pulses.   No murmur heard. Pulmonary/Chest: Effort normal and breath sounds normal. No respiratory distress.  Abdominal: Soft. She exhibits no distension. There is no tenderness.  Musculoskeletal: She exhibits no edema.  Lymphadenopathy:    She has no cervical adenopathy.  Neurological: She is alert and oriented to person, place, and time.  Skin: Skin is warm and dry.  Psychiatric: She has a normal mood and affect. Her behavior is normal.  Vitals reviewed.         Assessment & Plan:

## 2014-10-28 NOTE — Patient Instructions (Signed)
Schedule your complete physical in 6 months We'll notify you of your lab results and make any changes if needed Try and take your medications regularly- this is very important Keep up the good work!  You look great! Call with any questions or concerns Happy [birth year] Birthday!!!

## 2014-10-29 LAB — CBC WITH DIFFERENTIAL/PLATELET
Basophils Absolute: 0 10*3/uL (ref 0.0–0.1)
Basophils Relative: 0.6 % (ref 0.0–3.0)
EOS PCT: 0.7 % (ref 0.0–5.0)
Eosinophils Absolute: 0 10*3/uL (ref 0.0–0.7)
HCT: 35.8 % — ABNORMAL LOW (ref 36.0–46.0)
HEMOGLOBIN: 12.2 g/dL (ref 12.0–15.0)
Lymphocytes Relative: 30 % (ref 12.0–46.0)
Lymphs Abs: 1.7 10*3/uL (ref 0.7–4.0)
MCHC: 34.1 g/dL (ref 30.0–36.0)
MCV: 93.6 fl (ref 78.0–100.0)
MONO ABS: 0.4 10*3/uL (ref 0.1–1.0)
Monocytes Relative: 6.9 % (ref 3.0–12.0)
NEUTROS ABS: 3.6 10*3/uL (ref 1.4–7.7)
NEUTROS PCT: 61.8 % (ref 43.0–77.0)
Platelets: 269 10*3/uL (ref 150.0–400.0)
RBC: 3.82 Mil/uL — ABNORMAL LOW (ref 3.87–5.11)
RDW: 14.4 % (ref 11.5–15.5)
WBC: 5.8 10*3/uL (ref 4.0–10.5)

## 2014-10-29 LAB — BASIC METABOLIC PANEL
BUN: 17 mg/dL (ref 6–23)
CO2: 31 mEq/L (ref 19–32)
CREATININE: 0.97 mg/dL (ref 0.40–1.20)
Calcium: 9.7 mg/dL (ref 8.4–10.5)
Chloride: 102 mEq/L (ref 96–112)
GFR: 57.6 mL/min — ABNORMAL LOW (ref >60.00)
Glucose, Bld: 82 mg/dL (ref 70–99)
Potassium: 4.5 mEq/L (ref 3.5–5.1)
Sodium: 138 mEq/L (ref 135–145)

## 2014-10-29 LAB — HEPATIC FUNCTION PANEL
ALBUMIN: 3.7 g/dL (ref 3.5–5.2)
ALK PHOS: 50 U/L (ref 39–117)
ALT: 7 U/L (ref 0–35)
AST: 17 U/L (ref 0–37)
Bilirubin, Direct: 0 mg/dL (ref 0.0–0.3)
Total Bilirubin: 0.4 mg/dL (ref 0.2–1.2)
Total Protein: 6.3 g/dL (ref 6.0–8.3)

## 2014-10-29 LAB — TSH: TSH: 2.18 u[IU]/mL (ref 0.35–4.50)

## 2014-10-29 LAB — LIPID PANEL
CHOL/HDL RATIO: 4
CHOLESTEROL: 221 mg/dL — AB (ref 0–200)
HDL: 56.9 mg/dL (ref >39.00)
LDL CALC: 134 mg/dL — AB (ref 0–99)
NonHDL: 164.1
TRIGLYCERIDES: 151 mg/dL — AB (ref 0.0–149.0)
VLDL: 30.2 mg/dL (ref 0.0–40.0)

## 2014-11-03 DIAGNOSIS — H3581 Retinal edema: Secondary | ICD-10-CM | POA: Diagnosis not present

## 2014-11-03 DIAGNOSIS — H35361 Drusen (degenerative) of macula, right eye: Secondary | ICD-10-CM | POA: Diagnosis not present

## 2014-11-03 DIAGNOSIS — H34812 Central retinal vein occlusion, left eye: Secondary | ICD-10-CM | POA: Diagnosis not present

## 2014-11-03 DIAGNOSIS — H43813 Vitreous degeneration, bilateral: Secondary | ICD-10-CM | POA: Diagnosis not present

## 2014-11-03 NOTE — Assessment & Plan Note (Signed)
Chronic problem, adequate control.  Check labs.  No anticipated med changes.  Stressed need for daily compliance w/ med regimen.  Will follow.

## 2014-11-03 NOTE — Assessment & Plan Note (Signed)
Chronic problem.  Adequate control.  Refills provided.  Will follow.

## 2014-11-03 NOTE — Assessment & Plan Note (Signed)
Chronic problem.  Tolerating statin w/o difficulty.  Pt has had lapses in her medication administration.  Daughter has now assumed this responsibility.  Stressed need for daily use.  Check labs.  Adjust meds prn

## 2014-11-05 DIAGNOSIS — H6123 Impacted cerumen, bilateral: Secondary | ICD-10-CM | POA: Diagnosis not present

## 2014-11-05 DIAGNOSIS — H9113 Presbycusis, bilateral: Secondary | ICD-10-CM | POA: Diagnosis not present

## 2014-11-09 ENCOUNTER — Telehealth: Payer: Self-pay | Admitting: General Practice

## 2014-11-09 MED ORDER — TRAMADOL HCL 50 MG PO TABS: 50.0000 mg | ORAL_TABLET | Freq: Every day | ORAL | Status: DC

## 2014-11-09 NOTE — Telephone Encounter (Signed)
Last OV 10-28-14 Tramadol last filled 10-07-14 #30 with 0

## 2014-11-09 NOTE — Telephone Encounter (Signed)
Ok for #30 

## 2014-11-09 NOTE — Telephone Encounter (Signed)
Med filled and faxed.  

## 2014-11-18 DIAGNOSIS — H3581 Retinal edema: Secondary | ICD-10-CM | POA: Diagnosis not present

## 2014-11-18 DIAGNOSIS — H34812 Central retinal vein occlusion, left eye: Secondary | ICD-10-CM | POA: Diagnosis not present

## 2014-11-20 DIAGNOSIS — H903 Sensorineural hearing loss, bilateral: Secondary | ICD-10-CM | POA: Diagnosis not present

## 2014-12-07 ENCOUNTER — Other Ambulatory Visit: Payer: Self-pay | Admitting: Family Medicine

## 2014-12-08 NOTE — Telephone Encounter (Signed)
Last OV 10-28-14 Tramadol last filled 11-09-14 #30 with 0  CSC signed, UDS needed

## 2014-12-08 NOTE — Telephone Encounter (Signed)
Med filled and pt notified.  

## 2014-12-08 NOTE — Telephone Encounter (Signed)
Ok to print rx for tramadol.  Can complete UDS when she picks up.

## 2015-01-08 ENCOUNTER — Other Ambulatory Visit: Payer: Self-pay | Admitting: Family

## 2015-01-08 NOTE — Telephone Encounter (Signed)
Pt last seen 10/28/14 and has f/u 03/2015.  CSC on file.  Rx last given 12/08/14.  Rx printed and forwarded to PCP for signature.

## 2015-01-08 NOTE — Telephone Encounter (Signed)
PCP signed, [facility] and patient notified via voicemail.

## 2015-01-27 DIAGNOSIS — H43813 Vitreous degeneration, bilateral: Secondary | ICD-10-CM | POA: Diagnosis not present

## 2015-01-27 DIAGNOSIS — H3581 Retinal edema: Secondary | ICD-10-CM | POA: Diagnosis not present

## 2015-01-27 DIAGNOSIS — H35361 Drusen (degenerative) of macula, right eye: Secondary | ICD-10-CM | POA: Diagnosis not present

## 2015-01-27 DIAGNOSIS — H34812 Central retinal vein occlusion, left eye: Secondary | ICD-10-CM | POA: Diagnosis not present

## 2015-02-09 ENCOUNTER — Other Ambulatory Visit: Payer: Self-pay | Admitting: Family Medicine

## 2015-02-10 NOTE — Telephone Encounter (Signed)
Medication filled to pharmacy as requested.   

## 2015-02-10 NOTE — Telephone Encounter (Signed)
Last OV 10/28/14 Tramadol last filled 01/08/15 #30 with 0

## 2015-02-25 DIAGNOSIS — H34812 Central retinal vein occlusion, left eye: Secondary | ICD-10-CM | POA: Diagnosis not present

## 2015-03-17 ENCOUNTER — Other Ambulatory Visit: Payer: Self-pay | Admitting: Family Medicine

## 2015-03-18 NOTE — Telephone Encounter (Signed)
Last OV 10-28-14 Tramadol last filled 02/10/15 #30 with 0

## 2015-03-18 NOTE — Telephone Encounter (Signed)
Medication filled to pharmacy as requested.   

## 2015-04-14 ENCOUNTER — Encounter: Payer: Self-pay | Admitting: Family Medicine

## 2015-04-14 ENCOUNTER — Ambulatory Visit (INDEPENDENT_AMBULATORY_CARE_PROVIDER_SITE_OTHER): Payer: Medicare Other | Admitting: Family Medicine

## 2015-04-14 VITALS — BP 120/84 | HR 89 | Temp 97.9°F | Resp 16 | Ht 62.0 in | Wt 98.4 lb

## 2015-04-14 DIAGNOSIS — M810 Age-related osteoporosis without current pathological fracture: Secondary | ICD-10-CM | POA: Diagnosis not present

## 2015-04-14 DIAGNOSIS — F32A Depression, unspecified: Secondary | ICD-10-CM

## 2015-04-14 DIAGNOSIS — F22 Delusional disorders: Secondary | ICD-10-CM

## 2015-04-14 DIAGNOSIS — F329 Major depressive disorder, single episode, unspecified: Secondary | ICD-10-CM | POA: Diagnosis not present

## 2015-04-14 DIAGNOSIS — Z0000 Encounter for general adult medical examination without abnormal findings: Secondary | ICD-10-CM | POA: Diagnosis not present

## 2015-04-14 DIAGNOSIS — E785 Hyperlipidemia, unspecified: Secondary | ICD-10-CM

## 2015-04-14 DIAGNOSIS — Z23 Encounter for immunization: Secondary | ICD-10-CM | POA: Diagnosis not present

## 2015-04-14 DIAGNOSIS — I10 Essential (primary) hypertension: Secondary | ICD-10-CM | POA: Diagnosis not present

## 2015-04-14 NOTE — Patient Instructions (Signed)
Follow up in 6 months to recheck BP and cholesterol We'll notify you of your lab results and make any changes if needed Make sure you are taking your medications daily Make sure you are eating regularly Please consider seeing our psychiatrist for older adults Call with any questions or concerns Hang in there!!

## 2015-04-14 NOTE — Progress Notes (Signed)
   Subjective:    Patient ID: Kara Burnett, female    DOB: [birth year], 81 y.o.   MRN: [id number]  HPI Here today for CPE.  Risk Factors: HTN- chronic problem, on Benazepril.  Not taking medication regularly Hyperlipidemia- chronic problem, on Crestor.  Not taking medication regularly.  Not eating regularly. Vit D deficiency/Osteoporosis- last DEXA done 2013, due for repeat. Physical Activity: no formal exercise Depression: chronic problem, pt is very upset about living alone Hearing: decreased to conversational tones and whispered voice ADL's: independent Cognitive: pt's thought process has evidence of paranoia, possible hallucinations Home Safety: pt refuses to allow her children access to her home, having paranoia about people in her house Height, Weight, BMI, Visual Acuity: see vitals, vision corrected to 20/20 w/ glasses Counseling: no mammo, DEXA, colonoscopy- pt refusing.  Declines Flu. Labs Ordered: See A&P Care Plan: See A&P    Review of Systems Patient reports no vision changes, adenopathy,fever, weight change,  persistant/recurrent hoarseness , swallowing issues, chest pain, palpitations, edema, persistant/recurrent cough, hemoptysis, dyspnea (rest/exertional/paroxysmal nocturnal), gastrointestinal bleeding (melena, rectal bleeding), abdominal pain, significant heartburn, bowel changes, GU symptoms (dysuria, hematuria, incontinence), Gyn symptoms (abnormal  bleeding, pain),  syncope, focal weakness, numbness & tingling, skin/hair/nail changes, abnormal bruising or bleeding.   + hearing loss + memory loss    Objective:   Physical Exam General Appearance:    Alert, cooperative, no distress, appears stated age, thin  Head:    Normocephalic, without obvious abnormality, atraumatic  Eyes:    PERRL, conjunctiva/corneas clear, EOM's intact, fundi    benign, both eyes  Ears:    Normal TM's and external ear canals, both ears  Nose:   Nares normal, septum midline, mucosa  normal, no drainage    or sinus tenderness  Throat:   Lips, mucosa, and tongue normal; teeth and gums normal  Neck:   Supple, symmetrical, trachea midline, no adenopathy;    Thyroid: no enlargement/tenderness/nodules  Back:     Symmetric, no curvature, ROM normal, no CVA tenderness  Lungs:     Clear to auscultation bilaterally, respirations unlabored  Chest Wall:    No tenderness or deformity   Heart:    Regular rate and rhythm, S1 and S2 normal, no murmur, rub   or gallop  Breast Exam:    Deferred at pt's request  Abdomen:     Soft, non-tender, bowel sounds active all four quadrants,    no masses, no organomegaly  Genitalia:    Deferred at pt's request  Rectal:    Extremities:   Extremities normal, atraumatic, no cyanosis or edema  Pulses:   2+ and symmetric all extremities  Skin:   Skin color, texture, turgor normal, no rashes or lesions  Lymph nodes:   Cervical, supraclavicular, and axillary nodes normal  Neurologic:   CNII-XII intact, normal strength, sensation and reflexes    throughout          Assessment & Plan:

## 2015-04-14 NOTE — Progress Notes (Signed)
Pre visit review using our clinic review tool, if applicable. No additional management support is needed unless otherwise documented below in the visit note. 

## 2015-04-15 ENCOUNTER — Encounter: Payer: Self-pay | Admitting: General Practice

## 2015-04-15 LAB — CBC WITH DIFFERENTIAL/PLATELET
BASOS ABS: 0 10*3/uL (ref 0.0–0.1)
Basophils Relative: 0.7 % (ref 0.0–3.0)
EOS PCT: 0.9 % (ref 0.0–5.0)
Eosinophils Absolute: 0.1 10*3/uL (ref 0.0–0.7)
HCT: 36.1 % (ref 36.0–46.0)
Hemoglobin: 11.9 g/dL — ABNORMAL LOW (ref 12.0–15.0)
LYMPHS ABS: 2.2 10*3/uL (ref 0.7–4.0)
Lymphocytes Relative: 34.7 % (ref 12.0–46.0)
MCHC: 32.9 g/dL (ref 30.0–36.0)
MCV: 97 fl (ref 78.0–100.0)
MONOS PCT: 6.4 % (ref 3.0–12.0)
Monocytes Absolute: 0.4 10*3/uL (ref 0.1–1.0)
NEUTROS ABS: 3.6 10*3/uL (ref 1.4–7.7)
NEUTROS PCT: 57.3 % (ref 43.0–77.0)
PLATELETS: 275 10*3/uL (ref 150.0–400.0)
RBC: 3.72 Mil/uL — ABNORMAL LOW (ref 3.87–5.11)
RDW: 14.7 % (ref 11.5–15.5)
WBC: 6.4 10*3/uL (ref 4.0–10.5)

## 2015-04-15 LAB — BASIC METABOLIC PANEL
BUN: 18 mg/dL (ref 6–23)
CO2: 31 meq/L (ref 19–32)
CREATININE: 1.06 mg/dL (ref 0.40–1.20)
Calcium: 9.9 mg/dL (ref 8.4–10.5)
Chloride: 101 mEq/L (ref 96–112)
GFR: 51.94 mL/min — ABNORMAL LOW (ref >60.00)
GLUCOSE: 90 mg/dL (ref 70–99)
POTASSIUM: 4 meq/L (ref 3.5–5.1)
Sodium: 140 mEq/L (ref 135–145)

## 2015-04-15 LAB — LIPID PANEL
CHOLESTEROL: 230 mg/dL — AB (ref 0–200)
HDL: 61.7 mg/dL (ref >39.00)
LDL Cholesterol: 150 mg/dL — ABNORMAL HIGH (ref 0–99)
NonHDL: 168.04
Total CHOL/HDL Ratio: 4
Triglycerides: 92 mg/dL (ref 0.0–149.0)
VLDL: 18.4 mg/dL (ref 0.0–40.0)

## 2015-04-15 LAB — HEPATIC FUNCTION PANEL
ALK PHOS: 53 U/L (ref 39–117)
ALT: 8 U/L (ref 0–35)
AST: 21 U/L (ref 0–37)
Albumin: 3.9 g/dL (ref 3.5–5.2)
Bilirubin, Direct: 0.1 mg/dL (ref 0.0–0.3)
Total Bilirubin: 0.5 mg/dL (ref 0.2–1.2)
Total Protein: 6.6 g/dL (ref 6.0–8.3)

## 2015-04-15 LAB — TSH: TSH: 1.73 u[IU]/mL (ref 0.35–4.50)

## 2015-04-15 LAB — VITAMIN D 25 HYDROXY (VIT D DEFICIENCY, FRACTURES): VITD: 37.88 ng/mL (ref 30.00–100.00)

## 2015-04-18 ENCOUNTER — Other Ambulatory Visit: Payer: Self-pay | Admitting: Family Medicine

## 2015-04-18 NOTE — Assessment & Plan Note (Signed)
Chronic problem.  Pt is now having paranoia.  Unclear if this is related to her depression or if she now has dementia w/ behavioral disturbances.  Strongly encouraged her to see Geri-Psych- she declined.  Daughter is very distraught at current situation- mom is not allowing her in her home, has taken her name off bank and legal documents and put the name of a woman she used to work with on these documents, allowing her complete control.  Pt is in a dangerous and vulnerable situation.  Encouraged daughter to take her to [place] where they have a [place] unit.  Will follow.

## 2015-04-18 NOTE — Assessment & Plan Note (Signed)
New.  Pt is having clear paranoia about letting her children in her home, irrelevant items going missing or being rearranged.  She has removed her children from her bank accounts and legal documents and has named a woman that she used to work with in charge of her finances.  This clearly worries her children.  Pt is in a dangerous and vulnerable situation.  Recommended she see Alycia Patten- pt refused.  Encouraged daughter to take her to [facility] for evaluation and tx.  Will follow.

## 2015-04-18 NOTE — Assessment & Plan Note (Signed)
Pt's PE unchanged from previous w/ exception of paranoia.  Pt refusing colonoscopy, mammo, DEXA.  Agrees to flu shot.  Check labs.  Anticipatory guidance provided.

## 2015-04-18 NOTE — Assessment & Plan Note (Signed)
Pt refuses repeat DEXA.  Check Vit D level.  Replete prn.

## 2015-04-18 NOTE — Assessment & Plan Note (Signed)
Chronic problem.  Tolerating statin when she takes it but she is not taking medication regularly.  Check labs.

## 2015-04-18 NOTE — Assessment & Plan Note (Signed)
Chronic problem.  Adequate control.  Asymptomatic.  Stressed need for pt to take meds regularly as she is only taking them sporadically and frequently reorganizes her pill box after her daughter does it for the week.  Check labs.  No anticipated med changes.

## 2015-04-19 ENCOUNTER — Telehealth: Payer: Self-pay | Admitting: Family Medicine

## 2015-04-19 DIAGNOSIS — F0390 Unspecified dementia without behavioral disturbance: Secondary | ICD-10-CM

## 2015-04-19 NOTE — Telephone Encounter (Signed)
Last OV 04/14/15 Tramadol last filled 03/18/15 #30 with 0

## 2015-04-19 NOTE — Telephone Encounter (Signed)
Medication filled to pharmacy as requested.   

## 2015-04-19 NOTE — Telephone Encounter (Signed)
Caller name: Nydia Bouton   Relationship to patient: daughter  Can be reached: [phone]   Reason for call: she says that her sister doesn't feel comfortable taking her mom to the referring location that was suggested at there last visit.(in [place])  She would like to know if mom can be referred to a neurologist instead. She also states that her mom is also against seeing a psychologist.    She would like a call back to discuss further.

## 2015-04-20 NOTE — Telephone Encounter (Signed)
Called back this morning to speak with pt daughter. There was no answer and could not leave a message due to voicemail being full.

## 2015-04-20 NOTE — Telephone Encounter (Signed)
[place] for referral to Neuro- dx dementia

## 2015-04-21 NOTE — Telephone Encounter (Signed)
Called daughter again today, voicemail is still full.

## 2015-04-22 NOTE — Addendum Note (Signed)
Addended by: Davis Gourd on: 04/22/2015 04:18 PM   Modules accepted: Orders

## 2015-04-22 NOTE — Telephone Encounter (Signed)
Called an spoke with pt daughter, referral placed to neuro in [place] as requested. All appts should be scheduled with pt daughter Ladona Ridgel.

## 2015-04-28 DIAGNOSIS — H35033 Hypertensive retinopathy, bilateral: Secondary | ICD-10-CM | POA: Diagnosis not present

## 2015-04-28 DIAGNOSIS — H43813 Vitreous degeneration, bilateral: Secondary | ICD-10-CM | POA: Diagnosis not present

## 2015-04-28 DIAGNOSIS — H34812 Central retinal vein occlusion, left eye, with macular edema: Secondary | ICD-10-CM | POA: Diagnosis not present

## 2015-05-21 ENCOUNTER — Other Ambulatory Visit: Payer: Self-pay | Admitting: Family Medicine

## 2015-05-22 NOTE — Telephone Encounter (Signed)
Last OV 04-14-15 Tramadol last filled 04/19/15 #30 with 0

## 2015-05-22 NOTE — Telephone Encounter (Signed)
Medication phoned in.

## 2015-06-15 ENCOUNTER — Ambulatory Visit (INDEPENDENT_AMBULATORY_CARE_PROVIDER_SITE_OTHER): Payer: Medicare Other | Admitting: Neurology

## 2015-06-15 ENCOUNTER — Encounter: Payer: Self-pay | Admitting: Neurology

## 2015-06-15 VITALS — BP 124/68 | HR 85 | Resp 14 | Wt 103.0 lb

## 2015-06-15 DIAGNOSIS — F22 Delusional disorders: Secondary | ICD-10-CM

## 2015-06-15 DIAGNOSIS — F0390 Unspecified dementia without behavioral disturbance: Secondary | ICD-10-CM | POA: Diagnosis not present

## 2015-06-15 DIAGNOSIS — F03A0 Unspecified dementia, mild, without behavioral disturbance, psychotic disturbance, mood disturbance, and anxiety: Secondary | ICD-10-CM

## 2015-06-15 MED ORDER — DONEPEZIL HCL 10 MG PO TABS: ORAL_TABLET | ORAL | Status: DC
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
## 2015-06-15 NOTE — Patient Instructions (Signed)
1. Schedule MRI brain without contrast 2. Start Aricept 10mg : Take 1/2 tablet daily for 2 weeks, then increase to 1 tablet daily 3. Recommend contacting the [facility] for further guidance. Their number is [phone].  4. Follow-up in 6 months, call for any changes

## 2015-06-22 ENCOUNTER — Other Ambulatory Visit: Payer: Self-pay | Admitting: Family Medicine

## 2015-06-22 NOTE — Telephone Encounter (Signed)
Last OV 04/14/15 Tramadol last filled 05/22/15 #30 with 0

## 2015-06-23 NOTE — Telephone Encounter (Signed)
Faxed hardcopy for Tramadol to [facility] [place] 

## 2015-06-25 ENCOUNTER — Ambulatory Visit (HOSPITAL_COMMUNITY)
Admission: RE | Admit: 2015-06-25 | Discharge: 2015-06-25 | Disposition: A | Discharge status: Home / Self Care | Payer: Medicare Other | Source: Ambulatory Visit | Attending: Neurology | Admitting: Neurology

## 2015-06-25 DIAGNOSIS — F0390 Unspecified dementia without behavioral disturbance: Secondary | ICD-10-CM | POA: Diagnosis present

## 2015-06-25 DIAGNOSIS — G319 Degenerative disease of nervous system, unspecified: Secondary | ICD-10-CM | POA: Diagnosis not present

## 2015-06-25 DIAGNOSIS — R413 Other amnesia: Secondary | ICD-10-CM | POA: Insufficient documentation

## 2015-06-25 DIAGNOSIS — E785 Hyperlipidemia, unspecified: Secondary | ICD-10-CM | POA: Diagnosis not present

## 2015-06-25 DIAGNOSIS — I10 Essential (primary) hypertension: Secondary | ICD-10-CM | POA: Diagnosis not present

## 2015-06-25 DIAGNOSIS — F22 Delusional disorders: Secondary | ICD-10-CM | POA: Insufficient documentation

## 2015-06-29 ENCOUNTER — Telehealth: Payer: Self-pay | Admitting: Neurology

## 2015-06-29 NOTE — Telephone Encounter (Signed)
Lmovm to rtn my call. 

## 2015-06-29 NOTE — Telephone Encounter (Signed)
PT called and wanted to know if her MRI results were ready/Dawn CB# [phone]

## 2015-06-29 NOTE — Telephone Encounter (Signed)
-----   Message from Cameron Sprang, MD sent at 06/29/2015  2:07 PM EST ----- Pls let her/family know that MRI is unremarkable, no evidence of tumor, stroke, or bleed. It shows age-related changes. Thanks

## 2015-06-29 NOTE — Telephone Encounter (Signed)
Patient's daughter/Kara Burnett returned my call. Notified her of results.

## 2015-07-10 ENCOUNTER — Other Ambulatory Visit: Payer: Self-pay | Admitting: Family Medicine

## 2015-07-12 NOTE — Telephone Encounter (Signed)
Medication filled to pharmacy as requested.   

## 2015-07-26 ENCOUNTER — Other Ambulatory Visit: Payer: Self-pay | Admitting: Family Medicine

## 2015-07-27 NOTE — Telephone Encounter (Signed)
Last OV 04/14/15 Tramadol last filled 06/23/15 #30 with 0

## 2015-07-27 NOTE — Telephone Encounter (Signed)
Medication filled to pharmacy as requested.   

## 2015-08-09 ENCOUNTER — Encounter: Payer: Self-pay | Admitting: Neurology

## 2015-08-25 ENCOUNTER — Other Ambulatory Visit: Payer: Self-pay | Admitting: Family Medicine

## 2015-08-25 NOTE — Telephone Encounter (Signed)
Medication filled to pharmacy as requested.   

## 2015-08-25 NOTE — Telephone Encounter (Signed)
Last OV 04/14/15 Tramadol last filled 07/27/15 #30 with 0

## 2015-09-22 ENCOUNTER — Other Ambulatory Visit: Payer: Self-pay | Admitting: General Practice

## 2015-09-22 MED ORDER — TRAMADOL HCL 50 MG PO TABS: ORAL_TABLET | ORAL | Status: DC

## 2015-09-22 NOTE — Telephone Encounter (Signed)
Last OV 04/14/15 Tramadol last filled 08/25/15 #30 with 0

## 2015-09-22 NOTE — Telephone Encounter (Signed)
Medication filled to pharmacy as requested.   

## 2015-09-29 ENCOUNTER — Other Ambulatory Visit: Payer: Self-pay | Admitting: General Practice

## 2015-09-29 MED ORDER — BENAZEPRIL HCL 10 MG PO TABS: 10.0000 mg | ORAL_TABLET | Freq: Every day | ORAL | Status: DC

## 2015-10-13 ENCOUNTER — Ambulatory Visit (INDEPENDENT_AMBULATORY_CARE_PROVIDER_SITE_OTHER): Payer: Medicare Other | Admitting: Family Medicine

## 2015-10-13 ENCOUNTER — Encounter: Payer: Self-pay | Admitting: Family Medicine

## 2015-10-13 VITALS — BP 122/68 | HR 72 | Temp 97.9°F | Resp 16 | Ht 62.0 in | Wt 97.4 lb

## 2015-10-13 DIAGNOSIS — I10 Essential (primary) hypertension: Secondary | ICD-10-CM

## 2015-10-13 DIAGNOSIS — R600 Localized edema: Secondary | ICD-10-CM

## 2015-10-13 DIAGNOSIS — E785 Hyperlipidemia, unspecified: Secondary | ICD-10-CM | POA: Diagnosis not present

## 2015-10-13 DIAGNOSIS — E43 Unspecified severe protein-calorie malnutrition: Secondary | ICD-10-CM

## 2015-10-13 DIAGNOSIS — R609 Edema, unspecified: Secondary | ICD-10-CM | POA: Diagnosis not present

## 2015-10-13 LAB — CBC WITH DIFFERENTIAL/PLATELET
BASOS PCT: 0 %
Basophils Absolute: 0 cells/uL (ref 0–200)
Eosinophils Absolute: 53 cells/uL (ref 15–500)
Eosinophils Relative: 1 %
HEMATOCRIT: 34.5 % — AB (ref 35.0–45.0)
HEMOGLOBIN: 11.2 g/dL — AB (ref 11.7–15.5)
LYMPHS PCT: 34 %
Lymphs Abs: 1802 cells/uL (ref 850–3900)
MCH: 31.7 pg (ref 27.0–33.0)
MCHC: 32.5 g/dL (ref 32.0–36.0)
MCV: 97.7 fL (ref 80.0–100.0)
MONO ABS: 371 {cells}/uL (ref 200–950)
MPV: 9.3 fL (ref 7.5–12.5)
Monocytes Relative: 7 %
NEUTROS PCT: 58 %
Neutro Abs: 3074 cells/uL (ref 1500–7800)
Platelets: 247 10*3/uL (ref 140–400)
RBC: 3.53 MIL/uL — ABNORMAL LOW (ref 3.80–5.10)
RDW: 14.3 % (ref 11.0–15.0)
WBC: 5.3 10*3/uL (ref 3.8–10.8)

## 2015-10-13 LAB — POCT URINALYSIS DIPSTICK
BILIRUBIN UA: NEGATIVE
Blood, UA: NEGATIVE
GLUCOSE UA: NEGATIVE
KETONES UA: NEGATIVE
LEUKOCYTES UA: NEGATIVE
Nitrite, UA: NEGATIVE
PROTEIN UA: NEGATIVE
SPEC GRAV UA: 1.025
Urobilinogen, UA: 1
pH, UA: 6

## 2015-10-13 LAB — LIPID PANEL
Cholesterol: 130 mg/dL (ref 125–200)
HDL: 75 mg/dL (ref >=46)
LDL CALC: 45 mg/dL (ref <130)
Total CHOL/HDL Ratio: 1.7 Ratio (ref <=5.0)
Triglycerides: 49 mg/dL (ref <150)
VLDL: 10 mg/dL (ref <30)

## 2015-10-13 LAB — HEPATIC FUNCTION PANEL
ALK PHOS: 39 U/L (ref 33–130)
ALT: 14 U/L (ref 6–29)
AST: 26 U/L (ref 10–35)
Albumin: 3.5 g/dL — ABNORMAL LOW (ref 3.6–5.1)
BILIRUBIN INDIRECT: 0.3 mg/dL (ref 0.2–1.2)
Bilirubin, Direct: 0.1 mg/dL (ref <=0.2)
TOTAL PROTEIN: 5.8 g/dL — AB (ref 6.1–8.1)
Total Bilirubin: 0.4 mg/dL (ref 0.2–1.2)

## 2015-10-13 LAB — BASIC METABOLIC PANEL
BUN: 18 mg/dL (ref 7–25)
CHLORIDE: 104 mmol/L (ref 98–110)
CO2: 32 mmol/L — AB (ref 20–31)
Calcium: 9.4 mg/dL (ref 8.6–10.4)
Creat: 0.94 mg/dL — ABNORMAL HIGH (ref 0.60–0.88)
Glucose, Bld: 70 mg/dL (ref 65–99)
Potassium: 4.5 mmol/L (ref 3.5–5.3)
SODIUM: 142 mmol/L (ref 135–146)

## 2015-10-13 LAB — TSH: TSH: 2.17 mIU/L

## 2015-10-13 NOTE — Patient Instructions (Signed)
Schedule your complete physical in 6 months We'll notify you of your lab results and make any changes if needed We'll call you with your ultrasound appt for the leg swelling Continue to eat regularly and consider adding Ensure or other protein shakes Call with any questions or concerns Thanks for sticking with [place]! Happy Early Rudene Anda!!!

## 2015-10-13 NOTE — Assessment & Plan Note (Signed)
Chronic problem.  Well controlled.  Asymptomatic w/ exception of R leg swelling.  Check labs.  No anticipated med changes.

## 2015-10-13 NOTE — Assessment & Plan Note (Signed)
Chronic problem.  Tolerating statin w/o difficulty.  Check labs.  Adjust meds prn  

## 2015-10-13 NOTE — Progress Notes (Signed)
Pre visit review using our clinic review tool, if applicable. No additional management support is needed unless otherwise documented below in the visit note. 

## 2015-10-13 NOTE — Progress Notes (Signed)
   Subjective:    Patient ID: Kara Burnett, female    DOB: [birth year], 82 y.o.   MRN: [id number]  HPI HTN- chronic problem, on Benazepril w/ good BP control.  Denies CP, SOB, HAs, visual changes.  + swelling of R foot.  Denies pain in foot.  Swelling started ~3 weeks ago.  Hyperlipidemia- chronic problem, on Crestor.  Denies abd pain, N/V, myalgias.  Protein calorie malnutrition- pt has lost 6 more lbs.  Now 97.6 lbs.  BMI 17.81.  Daughter reports pt is eating regularly but pt has decreased appetite associated w/ stress and pt is very anxious.   Review of Systems For ROS see HPI     Objective:   Physical Exam  Constitutional: She appears well-developed. No distress.  Very thin  HENT:  Head: Normocephalic and atraumatic.  Eyes: Conjunctivae and EOM are normal. Pupils are equal, round, and reactive to light.  Neck: Normal range of motion. Neck supple. No thyromegaly present.  Cardiovascular: Normal rate, regular rhythm, normal heart sounds and intact distal pulses.   No murmur heard. Pulmonary/Chest: Effort normal and breath sounds normal. No respiratory distress.  Abdominal: Soft. She exhibits no distension. There is no tenderness.  Musculoskeletal: She exhibits edema (R foot and ankle swelling- 1+ to level of mid shin). She exhibits no tenderness.  Lymphadenopathy:    She has no cervical adenopathy.  Neurological: She is alert. No cranial nerve deficit.  Skin: Skin is warm and dry.  Psychiatric: She has a normal mood and affect. Her behavior is normal.  Vitals reviewed.         Assessment & Plan:

## 2015-10-13 NOTE — Assessment & Plan Note (Signed)
New.  Pt having unilateral leg swelling.  No redness or TTP but the unilateral nature of this requires [place] to r/o possibility of DVT.  Check venous doppler.  Encouraged increased fluid intake, elevation.  Will follow closely.

## 2015-10-13 NOTE — Assessment & Plan Note (Signed)
New.  Pt continues to lose weight.  Strongly encouraged snacks in between meals and adding protein shakes for extra calories and nutrition.  Daughter and pt expressed understanding.

## 2015-10-13 NOTE — Addendum Note (Signed)
Addended by: Waylan Rocher A on: 10/13/2015 03:45 PM   Modules accepted: Orders

## 2015-10-14 ENCOUNTER — Encounter: Payer: Self-pay | Admitting: General Practice

## 2015-10-17 ENCOUNTER — Ambulatory Visit (HOSPITAL_BASED_OUTPATIENT_CLINIC_OR_DEPARTMENT_OTHER)
Admission: RE | Admit: 2015-10-17 | Discharge: 2015-10-17 | Disposition: A | Discharge status: Home / Self Care | Payer: Medicare Other | Source: Ambulatory Visit | Attending: Family Medicine | Admitting: Family Medicine

## 2015-10-17 DIAGNOSIS — R600 Localized edema: Secondary | ICD-10-CM

## 2015-10-17 DIAGNOSIS — R609 Edema, unspecified: Secondary | ICD-10-CM | POA: Insufficient documentation

## 2015-10-18 ENCOUNTER — Encounter: Payer: Self-pay | Admitting: General Practice

## 2015-10-18 ENCOUNTER — Other Ambulatory Visit (HOSPITAL_BASED_OUTPATIENT_CLINIC_OR_DEPARTMENT_OTHER): Payer: Self-pay

## 2015-11-01 ENCOUNTER — Other Ambulatory Visit: Payer: Self-pay | Admitting: General Practice

## 2015-11-01 MED ORDER — TRAMADOL HCL 50 MG PO TABS
ORAL_TABLET | ORAL | Status: DC
Start: 2015-11-01 — End: 2015-11-22

## 2015-11-01 NOTE — Telephone Encounter (Signed)
Medication filled to pharmacy as requested.   

## 2015-11-01 NOTE — Telephone Encounter (Signed)
Last OV 10/13/15  Tramadol last filled 09/22/15 #30 with 0

## 2015-11-17 ENCOUNTER — Other Ambulatory Visit: Payer: Self-pay | Admitting: General Practice

## 2015-11-17 MED ORDER — ROSUVASTATIN CALCIUM 20 MG PO TABS: ORAL_TABLET | ORAL | Status: DC

## 2015-11-17 MED ORDER — BENAZEPRIL HCL 10 MG PO TABS: 10.0000 mg | ORAL_TABLET | Freq: Every day | ORAL | Status: DC

## 2015-11-18 ENCOUNTER — Encounter: Payer: Self-pay | Admitting: Family Medicine

## 2015-11-22 ENCOUNTER — Other Ambulatory Visit: Payer: Self-pay | Admitting: General Practice

## 2015-11-22 MED ORDER — TRAMADOL HCL 50 MG PO TABS: ORAL_TABLET | ORAL | Status: DC

## 2015-11-22 MED ORDER — SERTRALINE HCL 25 MG PO TABS: ORAL_TABLET | ORAL | Status: DC

## 2015-11-22 NOTE — Telephone Encounter (Signed)
Last OV 10/13/15 Tramadol last filled 11/01/15 #30 with 0

## 2015-11-22 NOTE — Telephone Encounter (Signed)
Medication filled to pharmacy as requested.   

## 2015-12-15 ENCOUNTER — Ambulatory Visit: Payer: Self-pay | Admitting: Neurology

## 2015-12-28 ENCOUNTER — Telehealth: Payer: Self-pay | Admitting: Neurology

## 2015-12-28 ENCOUNTER — Other Ambulatory Visit: Payer: Self-pay | Admitting: General Practice

## 2015-12-28 DIAGNOSIS — F0390 Unspecified dementia without behavioral disturbance: Secondary | ICD-10-CM

## 2015-12-28 DIAGNOSIS — F03A0 Unspecified dementia, mild, without behavioral disturbance, psychotic disturbance, mood disturbance, and anxiety: Secondary | ICD-10-CM

## 2015-12-28 MED ORDER — DONEPEZIL HCL 10 MG PO TABS: 10.0000 mg | ORAL_TABLET | Freq: Every day | ORAL | Status: DC

## 2015-12-28 NOTE — Telephone Encounter (Signed)
Last OV 10/13/15 Tramadol last filled 11/22/15 #30 with 0

## 2015-12-28 NOTE — Telephone Encounter (Signed)
Aricept refill requested. Per last office note- patient to remain on medication. Refill approved and sent to patient's pharmacy.   

## 2015-12-28 NOTE — Telephone Encounter (Signed)
Kara Burnett [birth year]. Her daughter called needing to get a refill on Donepezil. She will be out by the weekend. She will see Dr. Delice Lesch in November. [facility] [address]. Her daughter's number is [phone]. Thank you

## 2015-12-29 MED ORDER — TRAMADOL HCL 50 MG PO TABS: ORAL_TABLET | ORAL | Status: DC

## 2015-12-29 NOTE — Telephone Encounter (Signed)
Medication filled to pharmacy as requested.   

## 2016-01-29 ENCOUNTER — Other Ambulatory Visit: Payer: Self-pay | Admitting: Family Medicine

## 2016-01-31 NOTE — Telephone Encounter (Signed)
Last OV 10/13/15 Tramadol last filled 12/29/15 #30 with 0

## 2016-01-31 NOTE — Telephone Encounter (Signed)
Medication filled to pharmacy as requested.   

## 2016-03-01 ENCOUNTER — Other Ambulatory Visit: Payer: Self-pay | Admitting: Family Medicine

## 2016-03-05 ENCOUNTER — Other Ambulatory Visit: Payer: Self-pay | Admitting: Family Medicine

## 2016-03-06 NOTE — Telephone Encounter (Signed)
Medication filled to pharmacy as requested.   

## 2016-03-06 NOTE — Telephone Encounter (Signed)
Last OV 10/13/15 Tramadol last filled 01/31/16 #30 with 0

## 2016-04-07 ENCOUNTER — Ambulatory Visit: Payer: Self-pay | Admitting: Neurology

## 2016-04-11 ENCOUNTER — Other Ambulatory Visit: Payer: Self-pay | Admitting: Family Medicine

## 2016-04-12 NOTE — Telephone Encounter (Signed)
Last OV 10/13/15 Tramadol last filled 03/06/16 #30 with 0

## 2016-04-12 NOTE — Telephone Encounter (Signed)
Medication filled to pharmacy as requested.   

## 2016-04-14 ENCOUNTER — Telehealth: Payer: Self-pay | Admitting: Family Medicine

## 2016-04-14 MED ORDER — SERTRALINE HCL 25 MG PO TABS: ORAL_TABLET | ORAL | 1 refills | Status: DC

## 2016-04-14 NOTE — Telephone Encounter (Signed)
[facility]ertraline can be for a 90 day supply.

## 2016-04-14 NOTE — Telephone Encounter (Signed)
Med filled for #90.

## 2016-04-19 ENCOUNTER — Ambulatory Visit (INDEPENDENT_AMBULATORY_CARE_PROVIDER_SITE_OTHER): Payer: Medicare Other | Admitting: Family Medicine

## 2016-04-19 ENCOUNTER — Encounter: Payer: Self-pay | Admitting: Family Medicine

## 2016-04-19 VITALS — BP 102/65 | HR 64 | Temp 98.6°F | Resp 16 | Ht 62.0 in | Wt 98.1 lb

## 2016-04-19 DIAGNOSIS — Z23 Encounter for immunization: Secondary | ICD-10-CM | POA: Diagnosis not present

## 2016-04-19 DIAGNOSIS — Z0000 Encounter for general adult medical examination without abnormal findings: Secondary | ICD-10-CM

## 2016-04-19 DIAGNOSIS — E559 Vitamin D deficiency, unspecified: Secondary | ICD-10-CM

## 2016-04-19 DIAGNOSIS — I10 Essential (primary) hypertension: Secondary | ICD-10-CM

## 2016-04-19 DIAGNOSIS — E785 Hyperlipidemia, unspecified: Secondary | ICD-10-CM | POA: Diagnosis not present

## 2016-04-19 DIAGNOSIS — F22 Delusional disorders: Secondary | ICD-10-CM

## 2016-04-19 LAB — HEPATIC FUNCTION PANEL
ALBUMIN: 3.3 g/dL — AB (ref 3.6–5.1)
ALK PHOS: 47 U/L (ref 33–130)
ALT: 7 U/L (ref 6–29)
AST: 17 U/L (ref 10–35)
Bilirubin, Direct: 0.1 mg/dL (ref <=0.2)
Indirect Bilirubin: 0.4 mg/dL (ref 0.2–1.2)
TOTAL PROTEIN: 5.4 g/dL — AB (ref 6.1–8.1)
Total Bilirubin: 0.5 mg/dL (ref 0.2–1.2)

## 2016-04-19 LAB — CBC WITH DIFFERENTIAL/PLATELET
BASOS ABS: 0 {cells}/uL (ref 0–200)
Basophils Relative: 0 %
EOS PCT: 1 %
Eosinophils Absolute: 69 cells/uL (ref 15–500)
HCT: 31.3 % — ABNORMAL LOW (ref 35.0–45.0)
Hemoglobin: 10.1 g/dL — ABNORMAL LOW (ref 11.7–15.5)
Lymphocytes Relative: 22 %
Lymphs Abs: 1518 cells/uL (ref 850–3900)
MCH: 31.9 pg (ref 27.0–33.0)
MCHC: 32.3 g/dL (ref 32.0–36.0)
MCV: 98.7 fL (ref 80.0–100.0)
MONOS PCT: 6 %
MPV: 8.9 fL (ref 7.5–12.5)
Monocytes Absolute: 414 cells/uL (ref 200–950)
NEUTROS ABS: 4899 {cells}/uL (ref 1500–7800)
NEUTROS PCT: 71 %
PLATELETS: 338 10*3/uL (ref 140–400)
RBC: 3.17 MIL/uL — ABNORMAL LOW (ref 3.80–5.10)
RDW: 13.8 % (ref 11.0–15.0)
WBC: 6.9 10*3/uL (ref 3.8–10.8)

## 2016-04-19 LAB — LIPID PANEL
CHOL/HDL RATIO: 2.5 ratio (ref <=5.0)
CHOLESTEROL: 141 mg/dL (ref 125–200)
HDL: 57 mg/dL (ref >=46)
LDL Cholesterol: 70 mg/dL (ref <130)
TRIGLYCERIDES: 70 mg/dL (ref <150)
VLDL: 14 mg/dL (ref <30)

## 2016-04-19 LAB — BASIC METABOLIC PANEL
BUN: 15 mg/dL (ref 7–25)
CO2: 27 mmol/L (ref 20–31)
CREATININE: 1.07 mg/dL — AB (ref 0.60–0.88)
Calcium: 9.3 mg/dL (ref 8.6–10.4)
Chloride: 103 mmol/L (ref 98–110)
GLUCOSE: 77 mg/dL (ref 65–99)
POTASSIUM: 4.3 mmol/L (ref 3.5–5.3)
Sodium: 140 mmol/L (ref 135–146)

## 2016-04-19 LAB — TSH: TSH: 1.32 m[IU]/L

## 2016-04-19 NOTE — Progress Notes (Signed)
Pre visit review using our clinic review tool, if applicable. No additional management support is needed unless otherwise documented below in the visit note. 

## 2016-04-19 NOTE — Assessment & Plan Note (Signed)
Improved since starting Aricept.  Daughter feels sxs are much better.  Will continue to follow.

## 2016-04-19 NOTE — Progress Notes (Signed)
   Subjective:    Patient ID: Kara Burnett, female    DOB: 11-24-1924, 82 y.o.   MRN: [id number]  HPI Here today for CPE.  Risk Factors: HTN- chronic problem, on Benazepril daily. Hyperlipidemia- chronic problem, on Crestor daily Vit D deficiency- not currently taking any supplements Paranoia- ongoing issue for pt, this is part of her dementia.  On Aricept Physical Activity: no regular exercise or activity Fall Risk: low Depression: ongoing issue.  Pt reports her mood is 'down.  Down and out'.  Stopped taking the Sertraline.  Hearing: decreased even w/ hearing aides present ADL's: needs some assistance  Cognitive: + dementia and paranoia- on Aricept Home Safety: safe at home, lives alone.  Has good support from 2 daughters Height, Weight, BMI, Visual Acuity: see vitals, vision corrected to 20/20 w/ glasses Counseling: UTD on colonoscopy (no need for repeat), no longer getting mammograms.  UTD on Prevnar, Pneumovax.  Pt to get flu shot today Labs Ordered: See A&P Care Plan: See A&P    Review of Systems Patient reports no vision/ hearing changes, adenopathy,fever, weight change,  persistant/recurrent hoarseness , swallowing issues, chest pain, palpitations, edema, persistant/recurrent cough, hemoptysis, dyspnea (rest/exertional/paroxysmal nocturnal), gastrointestinal bleeding (melena, rectal bleeding), abdominal pain, significant heartburn, bowel changes, GU symptoms (dysuria, hematuria, incontinence), Gyn symptoms (abnormal  bleeding, pain),  syncope, focal weakness, memory loss, numbness & tingling, skin/hair/nail changes, abnormal bruising or bleeding.     Objective:   Physical Exam General Appearance:    Alert, cooperative, no distress, appears stated age  Head:    Normocephalic, without obvious abnormality, atraumatic  Eyes:    PERRL, conjunctiva/corneas clear, EOM's intact, fundi    benign, both eyes  Ears:    Normal TM's and external ear canals, both ears  Nose:   Nares  normal, septum midline, mucosa normal, no drainage    or sinus tenderness  Throat:   Lips, mucosa, and tongue normal; teeth and gums normal  Neck:   Supple, symmetrical, trachea midline, no adenopathy;    Thyroid: no enlargement/tenderness/nodules  Back:     Symmetric, no curvature, ROM normal, no CVA tenderness  Lungs:     Clear to auscultation bilaterally, respirations unlabored  Chest Wall:    No tenderness or deformity   Heart:    Regular rate and rhythm, S1 and S2 normal, no murmur, rub   or gallop  Breast Exam:    Deferred  Abdomen:     Soft, non-tender, bowel sounds active all four quadrants,    no masses, no organomegaly  Genitalia:    Deferred  Rectal:    Extremities:   Extremities normal, atraumatic, no cyanosis or edema  Pulses:   2+ and symmetric all extremities  Skin:   Skin color, texture, turgor normal, no rashes or lesions  Lymph nodes:   Cervical, supraclavicular, and axillary nodes normal  Neurologic:   CNII-XII intact, normal strength, sensation and reflexes    throughout          Assessment & Plan:

## 2016-04-19 NOTE — Patient Instructions (Signed)
Follow up in 6 months to recheck BP and cholesterol We'll notify you of your lab results and make any changes if needed Make sure you are eating regularly Make sure you taking the Sertraline daily- this will help with the mood You can put your pills in applesauce, yogurt, pudding to make it easier to swallow There is no need for colonoscopy or mammogram at this time Call with any questions or concerns Happy Holidays!!!

## 2016-04-19 NOTE — Assessment & Plan Note (Signed)
Ongoing issue.  Check labs.  Replete prn.

## 2016-04-19 NOTE — Assessment & Plan Note (Signed)
Chronic problem.  Excellent control today.  Currently asymptomatic.  Check labs.  No anticipated med changes.  Will follow.

## 2016-04-19 NOTE — Assessment & Plan Note (Signed)
Pt's PE WNL and unchanged from previous.  UTD on colonoscopy (due next year but unnecessary due to age).  Pt declines mammo.  UTD on pneumonia vaccines.  Flu shot given today.  Written screening schedule updated and given to pt.  Check labs.  Anticipatory guidance provided.

## 2016-04-19 NOTE — Assessment & Plan Note (Signed)
Chronic problem.  Tolerating statin w/o difficulty.  Check labs.  Adjust meds prn  

## 2016-04-20 ENCOUNTER — Other Ambulatory Visit: Payer: Self-pay | Admitting: General Practice

## 2016-04-20 LAB — VITAMIN D 25 HYDROXY (VIT D DEFICIENCY, FRACTURES): VIT D 25 HYDROXY: 21 ng/mL — AB (ref 30–100)

## 2016-04-20 MED ORDER — VITAMIN D (ERGOCALCIFEROL) 1.25 MG (50000 UNIT) PO CAPS: 50000.0000 [IU] | ORAL_CAPSULE | ORAL | 0 refills | Status: DC

## 2016-04-25 ENCOUNTER — Encounter: Payer: Self-pay | Admitting: Neurology

## 2016-04-25 ENCOUNTER — Ambulatory Visit (INDEPENDENT_AMBULATORY_CARE_PROVIDER_SITE_OTHER): Payer: Medicare Other | Admitting: Neurology

## 2016-04-25 VITALS — BP 112/62 | HR 79 | Ht 62.0 in | Wt 99.4 lb

## 2016-04-25 DIAGNOSIS — F03A0 Unspecified dementia, mild, without behavioral disturbance, psychotic disturbance, mood disturbance, and anxiety: Secondary | ICD-10-CM

## 2016-04-25 DIAGNOSIS — F0390 Unspecified dementia without behavioral disturbance: Secondary | ICD-10-CM | POA: Diagnosis not present

## 2016-04-25 MED ORDER — DONEPEZIL HCL 10 MG PO TABS: ORAL_TABLET | ORAL | 11 refills | Status: DC

## 2016-04-25 NOTE — Patient Instructions (Signed)
1. Continue Donepezil 10mg  daily 2. Control of blood pressure, cholesterol, as well as physical exercise and brain stimulation exercises are important for brain health 3. Follow-up in 1 year, call for any changes
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
## 2016-05-01 ENCOUNTER — Other Ambulatory Visit: Payer: Self-pay

## 2016-05-11 ENCOUNTER — Other Ambulatory Visit: Payer: Self-pay | Admitting: Family Medicine

## 2016-05-15 NOTE — Telephone Encounter (Signed)
Last OV 04/19/16 Tramadol last filled 04/12/16 #30 with 0

## 2016-05-15 NOTE — Telephone Encounter (Signed)
Medication filled to pharmacy as requested.   

## 2016-05-16 ENCOUNTER — Other Ambulatory Visit (INDEPENDENT_AMBULATORY_CARE_PROVIDER_SITE_OTHER): Payer: Medicare Other

## 2016-05-16 ENCOUNTER — Other Ambulatory Visit: Payer: Self-pay | Admitting: *Deleted

## 2016-05-16 DIAGNOSIS — D649 Anemia, unspecified: Secondary | ICD-10-CM

## 2016-05-16 LAB — FECAL OCCULT BLOOD, IMMUNOCHEMICAL: Fecal Occult Bld: NEGATIVE

## 2016-05-17 ENCOUNTER — Ambulatory Visit (INDEPENDENT_AMBULATORY_CARE_PROVIDER_SITE_OTHER): Payer: Medicare Other | Admitting: Family Medicine

## 2016-05-17 ENCOUNTER — Encounter: Payer: Self-pay | Admitting: Family Medicine

## 2016-05-17 VITALS — BP 96/82 | HR 68 | Temp 98.0°F | Resp 17 | Ht 62.0 in | Wt 93.0 lb

## 2016-05-17 DIAGNOSIS — R4182 Altered mental status, unspecified: Secondary | ICD-10-CM

## 2016-05-17 DIAGNOSIS — N390 Urinary tract infection, site not specified: Secondary | ICD-10-CM

## 2016-05-17 LAB — CBC WITH DIFFERENTIAL/PLATELET
BASOS ABS: 0 {cells}/uL (ref 0–200)
Basophils Relative: 0 %
EOS ABS: 0 {cells}/uL — AB (ref 15–500)
EOS PCT: 0 %
HCT: 34 % — ABNORMAL LOW (ref 35.0–45.0)
HEMOGLOBIN: 11.1 g/dL — AB (ref 11.7–15.5)
LYMPHS ABS: 1872 {cells}/uL (ref 850–3900)
Lymphocytes Relative: 13 %
MCH: 31.7 pg (ref 27.0–33.0)
MCHC: 32.6 g/dL (ref 32.0–36.0)
MCV: 97.1 fL (ref 80.0–100.0)
MONO ABS: 864 {cells}/uL (ref 200–950)
MPV: 8.6 fL (ref 7.5–12.5)
Monocytes Relative: 6 %
NEUTROS ABS: 11664 {cells}/uL — AB (ref 1500–7800)
Neutrophils Relative %: 81 %
Platelets: 511 10*3/uL — ABNORMAL HIGH (ref 140–400)
RBC: 3.5 MIL/uL — ABNORMAL LOW (ref 3.80–5.10)
RDW: 13.4 % (ref 11.0–15.0)
WBC: 14.4 10*3/uL — ABNORMAL HIGH (ref 3.8–10.8)

## 2016-05-17 LAB — BASIC METABOLIC PANEL
BUN: 26 mg/dL — ABNORMAL HIGH (ref 7–25)
CHLORIDE: 100 mmol/L (ref 98–110)
CO2: 30 mmol/L (ref 20–31)
Calcium: 9.8 mg/dL (ref 8.6–10.4)
Creat: 0.89 mg/dL — ABNORMAL HIGH (ref 0.60–0.88)
GLUCOSE: 92 mg/dL (ref 65–99)
POTASSIUM: 3.8 mmol/L (ref 3.5–5.3)
SODIUM: 139 mmol/L (ref 135–146)

## 2016-05-17 LAB — POCT URINALYSIS DIPSTICK
Bilirubin, UA: NEGATIVE
Blood, UA: NEGATIVE
GLUCOSE UA: NEGATIVE
Ketones, UA: NEGATIVE
Leukocytes, UA: NEGATIVE
NITRITE UA: NEGATIVE
Protein, UA: 0.3
Spec Grav, UA: 1.02
Urobilinogen, UA: 1
pH, UA: 6

## 2016-05-17 LAB — TSH: TSH: 1.17 mIU/L

## 2016-05-17 NOTE — Progress Notes (Signed)
Pre visit review using our clinic review tool, if applicable. No additional management support is needed unless otherwise documented below in the visit note. 

## 2016-05-17 NOTE — Patient Instructions (Signed)
Follow up as needed We will notify you of your lab results and make any changes if needed DRINK PLENTY OF FLUIDS!!!! If she is not able to drink fluids or symptoms change or worsen- please go to the ER Call with any questions or concerns Hang in there!!!

## 2016-05-17 NOTE — Progress Notes (Signed)
   Subjective:    Patient ID: Kara Burnett, female    DOB: Oct 18, 1924, 82 y.o.   MRN: [id number]  HPI Altered mental status- daughter reports sxs started on Sunday.  She is down 6 lbs since 11/7- daughter reports she is not eating, 'it's like she's given up'.  Pt is asking to be left alone, doesn't want to go anywhere, not taking medication.  Daughter reports confusion.  Pt complained of body aches- improved w/ Advil.  No fevers.  Pt has not been wanting to change clothes.  Daughter reports that pt is barely taking any medication so taking too much is not a concern.   Review of Systems For ROS see HPI     Objective:   Physical Exam  Constitutional: She is oriented to person, place, and time.  Thin, frail, but no distress  HENT:  Head: Normocephalic and atraumatic.  Eyes: Conjunctivae and EOM are normal. Pupils are equal, round, and reactive to light.  Neck: Normal range of motion. Neck supple. No thyromegaly present.  Cardiovascular: Normal rate, regular rhythm, normal heart sounds and intact distal pulses.   Pulmonary/Chest: Effort normal and breath sounds normal. No respiratory distress. She has no wheezes. She has no rales.  Lymphadenopathy:    She has no cervical adenopathy.  Neurological: She is alert and oriented to person, place, and time. No cranial nerve deficit.  Pt is oriented to person, place, year, month, Holiday, daughter, who I am.  Answering questions appropriately  Skin: Skin is warm and dry.  Skin is tenting on back of hands  Vitals reviewed.         Assessment & Plan:  Altered mental status- new.  Daughter reports sxs were much more apparent Sunday-Tuesday.  Much more alert today.  PE and UA WNL w/ exception of dehydration.  Offered family 2 choices- do labs and push fluids at home or go to ER for IVF.  Pt is very clear that she doesn't want to go to ER and reports she will drink fluids at home and daughter will supervise.  Daughter is aware that if anything  changes she will take her directly to ER.  Will follow closely.

## 2016-05-18 ENCOUNTER — Other Ambulatory Visit: Payer: Self-pay | Admitting: General Practice

## 2016-05-18 DIAGNOSIS — D72829 Elevated white blood cell count, unspecified: Secondary | ICD-10-CM

## 2016-05-18 MED ORDER — SULFAMETHOXAZOLE-TRIMETHOPRIM 800-160 MG PO TABS: 1.0000 | ORAL_TABLET | Freq: Two times a day (BID) | ORAL | 0 refills | Status: DC

## 2016-05-18 NOTE — Progress Notes (Signed)
Called pt and lmovm to return call.

## 2016-05-21 ENCOUNTER — Emergency Department (HOSPITAL_COMMUNITY)
Admission: EM | Admit: 2016-05-21 | Discharge: 2016-05-21 | Disposition: A | Discharge status: Home / Self Care | Payer: Medicare Other | Attending: Emergency Medicine | Admitting: Emergency Medicine

## 2016-05-21 ENCOUNTER — Encounter (HOSPITAL_COMMUNITY): Payer: Self-pay | Admitting: *Deleted

## 2016-05-21 ENCOUNTER — Emergency Department (HOSPITAL_COMMUNITY): Payer: Medicare Other

## 2016-05-21 DIAGNOSIS — Z79899 Other long term (current) drug therapy: Secondary | ICD-10-CM | POA: Insufficient documentation

## 2016-05-21 DIAGNOSIS — W1830XA Fall on same level, unspecified, initial encounter: Secondary | ICD-10-CM | POA: Insufficient documentation

## 2016-05-21 DIAGNOSIS — J189 Pneumonia, unspecified organism: Secondary | ICD-10-CM

## 2016-05-21 DIAGNOSIS — J181 Lobar pneumonia, unspecified organism: Secondary | ICD-10-CM | POA: Diagnosis not present

## 2016-05-21 DIAGNOSIS — M7989 Other specified soft tissue disorders: Secondary | ICD-10-CM | POA: Diagnosis not present

## 2016-05-21 DIAGNOSIS — Z7982 Long term (current) use of aspirin: Secondary | ICD-10-CM | POA: Insufficient documentation

## 2016-05-21 DIAGNOSIS — Y999 Unspecified external cause status: Secondary | ICD-10-CM | POA: Diagnosis not present

## 2016-05-21 DIAGNOSIS — W19XXXA Unspecified fall, initial encounter: Secondary | ICD-10-CM

## 2016-05-21 DIAGNOSIS — Y939 Activity, unspecified: Secondary | ICD-10-CM | POA: Diagnosis not present

## 2016-05-21 DIAGNOSIS — Y929 Unspecified place or not applicable: Secondary | ICD-10-CM | POA: Insufficient documentation

## 2016-05-21 DIAGNOSIS — R531 Weakness: Secondary | ICD-10-CM | POA: Diagnosis present

## 2016-05-21 DIAGNOSIS — I10 Essential (primary) hypertension: Secondary | ICD-10-CM | POA: Insufficient documentation

## 2016-05-21 LAB — COMPREHENSIVE METABOLIC PANEL
ALK PHOS: 49 U/L (ref 38–126)
ALT: 18 U/L (ref 14–54)
AST: 24 U/L (ref 15–41)
Albumin: 2.7 g/dL — ABNORMAL LOW (ref 3.5–5.0)
Anion gap: 6 (ref 5–15)
BILIRUBIN TOTAL: 0.5 mg/dL (ref 0.3–1.2)
BUN: 17 mg/dL (ref 6–20)
CALCIUM: 9 mg/dL (ref 8.9–10.3)
CHLORIDE: 98 mmol/L — AB (ref 101–111)
CO2: 29 mmol/L (ref 22–32)
CREATININE: 0.93 mg/dL (ref 0.44–1.00)
GFR calc Af Amer: >60 mL/min (ref >60)
GFR, EST NON AFRICAN AMERICAN: 53 mL/min — AB (ref >60)
Glucose, Bld: 88 mg/dL (ref 65–99)
Potassium: 4.1 mmol/L (ref 3.5–5.1)
Sodium: 133 mmol/L — ABNORMAL LOW (ref 135–145)
Total Protein: 5.8 g/dL — ABNORMAL LOW (ref 6.5–8.1)

## 2016-05-21 LAB — CBC WITH DIFFERENTIAL/PLATELET
BASOS ABS: 0 10*3/uL (ref 0.0–0.1)
Basophils Relative: 0 %
EOS PCT: 0 %
Eosinophils Absolute: 0 10*3/uL (ref 0.0–0.7)
HCT: 30.6 % — ABNORMAL LOW (ref 36.0–46.0)
Hemoglobin: 10 g/dL — ABNORMAL LOW (ref 12.0–15.0)
LYMPHS PCT: 12 %
Lymphs Abs: 1.2 10*3/uL (ref 0.7–4.0)
MCH: 32.1 pg (ref 26.0–34.0)
MCHC: 32.7 g/dL (ref 30.0–36.0)
MCV: 98.1 fL (ref 78.0–100.0)
MONO ABS: 0.6 10*3/uL (ref 0.1–1.0)
Monocytes Relative: 6 %
Neutro Abs: 7.6 10*3/uL (ref 1.7–7.7)
Neutrophils Relative %: 82 %
PLATELETS: 433 10*3/uL — AB (ref 150–400)
RBC: 3.12 MIL/uL — ABNORMAL LOW (ref 3.87–5.11)
RDW: 13.5 % (ref 11.5–15.5)
WBC: 9.4 10*3/uL (ref 4.0–10.5)

## 2016-05-21 LAB — URINALYSIS, ROUTINE W REFLEX MICROSCOPIC
GLUCOSE, UA: NEGATIVE mg/dL
KETONES UR: NEGATIVE mg/dL
Nitrite: NEGATIVE
PROTEIN: NEGATIVE mg/dL
Specific Gravity, Urine: 1.018 (ref 1.005–1.030)
pH: 7 (ref 5.0–8.0)

## 2016-05-21 LAB — URINE MICROSCOPIC-ADD ON

## 2016-05-21 LAB — TROPONIN I

## 2016-05-21 MED ORDER — IOPAMIDOL (ISOVUE-370) INJECTION 76%
100.0000 mL | Freq: Once | INTRAVENOUS | Status: AC | PRN
  Administered 2016-05-21: 80 mL via INTRAVENOUS

## 2016-05-21 MED ORDER — IOPAMIDOL (ISOVUE-370) INJECTION 76%
INTRAVENOUS | Status: AC
  Filled 2016-05-21: qty 100

## 2016-05-21 MED ORDER — SODIUM CHLORIDE 0.9 % IV SOLN
Freq: Once | INTRAVENOUS | Status: AC
  Administered 2016-05-21: 15:00:00 via INTRAVENOUS

## 2016-05-21 MED ORDER — SODIUM CHLORIDE 0.9 % IJ SOLN
INTRAMUSCULAR | Status: AC
  Filled 2016-05-21: qty 50

## 2016-05-21 MED ORDER — DEXTROSE 5 % IV SOLN
1.0000 g | Freq: Once | INTRAVENOUS | Status: AC
  Administered 2016-05-21: 1 g via INTRAVENOUS
  Filled 2016-05-21: qty 10

## 2016-05-21 MED ORDER — AZITHROMYCIN 200 MG/5ML PO SUSR
250.0000 mg | Freq: Every day | ORAL | 0 refills | Status: AC
Start: 2016-05-21 — End: 2016-05-25

## 2016-05-21 MED ORDER — AMOXICILLIN-POT CLAVULANATE 250-62.5 MG/5ML PO SUSR: 500.0000 mg | Freq: Three times a day (TID) | ORAL | 0 refills | Status: DC

## 2016-05-21 NOTE — ED Notes (Signed)
Bed: [id number] Expected date:  Expected time:  Means of arrival:  Comments: 82 yo fall; no injury-eval only

## 2016-05-21 NOTE — ED Notes (Signed)
Patient transported to CT 

## 2016-05-21 NOTE — ED Triage Notes (Signed)
Per EMS pt from home, with c/o unwitnessed fall sometime last night, found on the floor by family member this morning. Pt denies any pain, family wants her evaluated.

## 2016-05-21 NOTE — ED Notes (Signed)
ED Provider at bedside. 

## 2016-05-21 NOTE — ED Provider Notes (Signed)
[facility] DEPT Provider Note   CSN: [id number] Arrival date & time: 05/21/16  1220     History   Chief Complaint Chief Complaint  Patient presents with  . Fall    HPI Kara Burnett is a 82 y.o. female.  HPI Patient reports that she got up during the night and lost her balance and fell. She reports when she had fallen she was not able stand back up. She reports she is able to crawl to her recliner and sat next to her propped up until morning. Family members report that they checked in on her normal morning and found her seated by the recliner. Patient does not have any focal pain complaints. Members report however they concerned because she seemed a little more weak over the past several days. She was seen by her primary care doctor and told that she was somewhat dehydrated and was to increase her oral intake. They have been doing that and the patient has been cooperative. The patient was also started on Bactrim for urinary tract infection. He has not had any fevers, no chest pain, no shortness of breath, no cough. Past Medical History:  Diagnosis Date  . Anemia   . GERD (gastroesophageal reflux disease)   . Herpes zoster   . Hyperlipidemia   . Hypertension   . Osteoporosis   . Retinal vein occlusion 2015   Dr.Sanders    Patient Active Problem List   Diagnosis Date Noted  . Protein-calorie malnutrition, severe ([place]) 10/13/2015  . Edema of right foot 10/13/2015  . Mild dementia 06/15/2015  . Physical exam 04/14/2015  . Paranoia ([place]) 04/14/2015  . Acute bronchitis 04/18/2014  . Absence of bladder continence 04/18/2014  . Atypical nevus of neck 10/01/2013  . Acute upper respiratory infections of unspecified site 10/16/2012  . Bilateral conjunctivitis 10/16/2012  . Chest rales 10/16/2012  . Allergic rhinitis, cause unspecified 10/16/2012  . Hypercalcemia 04/10/2012  . Vitamin D deficiency 05/09/2010  . ANEMIA, MILD 05/09/2010  . RESTLESS LEG SYNDROME 05/09/2010    . FATIGUE 11/16/2009  . POSTURAL LIGHTHEADEDNESS 02/01/2009  . Other specified disease of white blood cells 10/01/2008  . Depression 03/26/2008  . Pernicious anemia 07/05/2007  . CERVICAL RADICULOPATHY 07/05/2007  . Hyperlipidemia 04/06/2007  . Essential hypertension 04/06/2007  . GERD 04/06/2007  . Osteoporosis 04/06/2007    Past Surgical History:  Procedure Laterality Date  . Cataract surgery     Bilaterally  . CHOLECYSTECTOMY  1990  . COLONOSCOPY  2008   negative  . sacroplasty     post pelvic fracture    OB History    No data available       Home Medications    Prior to Admission medications   Medication Sig Start Date End Date Taking? Authorizing Provider  amitriptyline (ELAVIL) 10 MG tablet TAKE 1 TABLET (10 MG TOTAL) BY MOUTH AT BEDTIME. 03/02/16  Yes Midge Minium, MD  aspirin 81 MG tablet Take 81 mg by mouth daily.   Yes Historical Provider, MD  benazepril (LOTENSIN) 10 MG tablet Take 1 tablet (10 mg total) by mouth daily. 11/17/15  Yes Midge Minium, MD  donepezil (ARICEPT) 10 MG tablet Take 1 tablet daily 04/25/16  Yes Cameron Sprang, MD  rosuvastatin (CRESTOR) 20 MG tablet TAKE 1 TABLET EVERY DAY 11/17/15  Yes Midge Minium, MD  sertraline (ZOLOFT) 25 MG tablet TAKE 1 TABLET (25 MG TOTAL) BY MOUTH DAILY. 04/14/16  Yes Midge Minium, MD  sulfamethoxazole-trimethoprim (BACTRIM  DS,SEPTRA DS) 800-160 MG tablet Take 1 tablet by mouth 2 (two) times daily. 05/18/16 06/01/16 Yes Midge Minium, MD  traMADol (ULTRAM) 50 MG tablet TAKE 1 TABLET AT BEDTIME AS NEEDED FOR PAIN 05/15/16  Yes Midge Minium, MD  Vitamin D, Ergocalciferol, (DRISDOL) 50000 units CAPS capsule Take 50,000 Units by mouth every 7 (seven) days. 04/20/16  Yes Historical Provider, MD  amoxicillin-clavulanate (AUGMENTIN) 250-62.5 MG/5ML suspension Take 10 mLs (500 mg total) by mouth 3 (three) times daily. 05/21/16   Charlesetta Shanks, MD  azithromycin (ZITHROMAX) 200 MG/5ML  suspension Take 6.3 mLs (250 mg total) by mouth daily. TAKE 500mg  (12 ml) on the first day, then take 43ml for the next 4 days. 05/21/16 05/25/16  Charlesetta Shanks, MD    Family History Family History  Problem Relation Age of Onset  . Stroke Father   . Heart disease Father     MI in 51s  . Heart disease Sister     MI in 48s  . Diabetes Sister   . Stroke Mother     coma  . Hypothyroidism Son   . Heart disease Son     Social History Social History  Substance Use Topics  . Smoking status: Never Smoker  . Smokeless tobacco: Never Used  . Alcohol use No     Allergies   Ciprofloxacin   Review of Systems Review of Systems 10 Systems reviewed and are negative for acute change except as noted in the HPI.  Physical Exam Updated Vital Signs BP 145/65   Pulse 62   Temp 97.9 F (36.6 C) (Oral)   Resp 19   SpO2 94%   Physical Exam  Constitutional: She is oriented to person, place, and time.  Patient is thin and frail but alert and nontoxic. No respiratory distress. Mental status is clear.  HENT:  Head: Normocephalic and atraumatic.  Right Ear: External ear normal.  Left Ear: External ear normal.  Nose: Nose normal.  Mouth/Throat: Oropharynx is clear and moist.  Eyes: EOM are normal. Pupils are equal, round, and reactive to light.  Neck: Neck supple.  Cardiovascular: Normal rate, regular rhythm, normal heart sounds and intact distal pulses.   Pulmonary/Chest: Effort normal and breath sounds normal. She exhibits no tenderness.  Abdominal: Soft. She exhibits no distension. There is no tenderness. There is no guarding.  Musculoskeletal: Normal range of motion. She exhibits edema. She exhibits no tenderness or deformity.  1-2+ pitting edema bilateral lower extremities. Range of motion is intact. Patient complex and extend at the hips knees and ankles and push against resistance without pain.  Neurological: She is alert and oriented to person, place, and time. No cranial nerve  deficit. She exhibits normal muscle tone. Coordination normal.  Skin: Skin is warm and dry.  Psychiatric: She has a normal mood and affect.     ED Treatments / Results  Labs (all labs ordered are listed, but only abnormal results are displayed) Labs Reviewed  COMPREHENSIVE METABOLIC PANEL - Abnormal; Notable for the following:       Result Value   Sodium 133 (*)    Chloride 98 (*)    Total Protein 5.8 (*)    Albumin 2.7 (*)    GFR calc non Af Amer 53 (*)    All other components within normal limits  CBC WITH DIFFERENTIAL/PLATELET - Abnormal; Notable for the following:    RBC 3.12 (*)    Hemoglobin 10.0 (*)    HCT 30.6 (*)  Platelets 433 (*)    All other components within normal limits  URINALYSIS, ROUTINE W REFLEX MICROSCOPIC (NOT AT [facility]) - Abnormal; Notable for the following:    APPearance TURBID (*)    Hgb urine dipstick LARGE (*)    Bilirubin Urine SMALL (*)    Leukocytes, UA MODERATE (*)    All other components within normal limits  URINE MICROSCOPIC-ADD ON - Abnormal; Notable for the following:    Squamous Epithelial / LPF 6-30 (*)    Bacteria, UA MANY (*)    Crystals TRIPLE PHOSPHATE CRYSTALS (*)    All other components within normal limits  TROPONIN I    EKG  EKG Interpretation None       Radiology Ct Angio Chest Pe W/cm &/or Wo Cm  Result Date: 05/21/2016 CLINICAL DATA:  [place] last night. New opacity at the left lung base on a portable chest radiograph obtained earlier today. EXAM: CT ANGIOGRAPHY CHEST WITH CONTRAST TECHNIQUE: Multidetector CT imaging of the chest was performed using the standard protocol during bolus administration of intravenous contrast. Multiplanar CT image reconstructions and MIPs were obtained to evaluate the vascular anatomy. CONTRAST:  100 cc Isovue 370 COMPARISON:  Portable chest obtained earlier today and previous chest radiographs dated 04/17/2014. FINDINGS: Cardiovascular: Satisfactory opacification of the pulmonary arteries to  the segmental level. No evidence of pulmonary embolism. Normal heart size. No pericardial effusion. Mediastinum/Nodes: Large hiatal hernia containing the entire stomach as well as a significant portion of the transverse colon. Mildly prominent right hilar node with a short axis diameter of 9 mm on image number 37 of series 4. There is also a mildly prominent left hilar node with a short axis diameter of 7 mm on 43 of series 4. Lungs/Pleura: Multiple patchy, irregular densities in the right upper lobe and in the right middle lobe as well as right middle lobe atelectasis. There is also a moderate amount of lingular atelectasis, corresponding to the majority of the increased density seen at the left lung base on the portable chest earlier today. There is also a milder amount of left lower lobe atelectasis. The lungs are mildly hyperexpanded with mild diffuse peribronchial thickening. Also noted is a small left pleural effusion and minimal right pleural effusion. Upper Abdomen: Cholecystectomy clips. Musculoskeletal: Thoracic spine degenerative changes. Review of the MIP images confirms the above findings. IMPRESSION: 1. No pulmonary emboli. 2. Lingular and right middle lobe atelectasis 3. Small irregular areas of patchy density in the right upper lobe and right middle lobe. Some of these are somewhat nodular and irregular. These are nonspecific and differential considerations include focal pneumonia, areas of fungal infection and neoplasia. These could be followed with a repeat chest CT with contrast in 6 months following appropriate antibiotic treatment. 4. Minimal bilateral hilar adenopathy. This could be reactive in nature. Metastatic adenopathy is less likely. 5. Small left pleural effusion and minimal right pleural effusion. 6. Large hiatal hernia containing the entire stomach as well as a significant portion of the transverse colon. 7. Mild changes of COPD and chronic bronchitis. Electronically Signed   By:  Claudie Revering M.D.   On: 05/21/2016 16:28   Dg Chest Port 1 View  Result Date: 05/21/2016 CLINICAL DATA:  82 year old female with history of trauma from a fall this morning. Weakness. Shortness of breath. EXAM: PORTABLE CHEST 1 VIEW COMPARISON:  Chest x-ray 04/17/2014. FINDINGS: New opacity in the periphery of the base of the left hemithorax, which does not appear to obscure the adjacent lateral  left hemidiaphragm. Right lung is clear. No right pleural effusion. No evidence of pulmonary edema. No pneumothorax. Large hiatal hernia. Heart size is normal. Upper mediastinal contours are within normal limits. Aortic atherosclerosis. Visualized bony thorax appears intact. IMPRESSION: 1. New opacity in the lower left hemithorax is of uncertain etiology and significance. Given the history of a fall earlier today, the possibility of a posttraumatic abnormality such as a pulmonary contusion, hemothorax or extrapleural hemorrhage should be considered. However, differential considerations are broad, and include pulmonary infarct, infectious consolidation underlying mass or unrelated loculated pleural effusion. These findings could be better evaluated with PE protocol CT scan if clinically appropriate. 2. Large hiatal hernia. 3. Aortic atherosclerosis. Electronically Signed   By: Vinnie Langton M.D.   On: 05/21/2016 13:48    Procedures Procedures (including critical care time)  Medications Ordered in ED Medications  sodium chloride 0.9 % injection (not administered)  iopamidol (ISOVUE-370) 76 % injection (not administered)  cefTRIAXone (ROCEPHIN) 1 g in dextrose 5 % 50 mL IVPB (1 g Intravenous New Bag/Given 05/21/16 1654)  0.9 %  sodium chloride infusion ( Intravenous New Bag/Given 05/21/16 1520)  iopamidol (ISOVUE-370) 76 % injection 100 mL (80 mLs Intravenous Contrast Given 05/21/16 1554)     Initial Impression / Assessment and Plan / ED Course  I have reviewed the triage vital signs and the nursing  notes.  Pertinent labs & imaging results that were available during my care of the patient were reviewed by me and considered in my medical decision making (see chart for details).  Clinical Course     Final Clinical Impressions(s) / ED Diagnoses   Final diagnoses:  Fall, initial encounter  General weakness  Community acquired pneumonia of right middle lobe of lung ([place])  No evidence of acute injury from the patient's fall. CT scan shows findings that are nonspecific but possibly represent pneumonia versus fungal infection versus neoplasm per radiology. At this time, patient will be treated for community-acquired pneumonia with a planned follow-up with her physician for surveillance of this and her general condition. Family murmurs for request liquids due to patient having trouble taking pills. Augmentin and Zithromax prescribed in liquid form  New Prescriptions New Prescriptions   AMOXICILLIN-CLAVULANATE (AUGMENTIN) 250-62.5 MG/5ML SUSPENSION    Take 10 mLs (500 mg total) by mouth 3 (three) times daily.   AZITHROMYCIN (ZITHROMAX) 200 MG/5ML SUSPENSION    Take 6.3 mLs (250 mg total) by mouth daily. TAKE 500mg  (12 ml) on the first day, then take 28ml for the next 4 days.     Charlesetta Shanks, MD 05/21/16 [phone]

## 2016-05-21 NOTE — Discharge Instructions (Signed)
CT scan of the chest shows an area of abnormality that is suspicious for pneumonia versus fungal infection versus possible cancer by the radiologist's interpretation. At this time, a course of antibiotics will be prescribed.   TAKE BOTH AUGMENTIN (STARTING TOMORROW) AND ZITHROMAX (STARTING TODAY) AS PRESCRIBED. ON THE FIRST DAY OF ZITHROMAX YOUR DOSE IS 12ML, ON DAYS 2-5 YOUR DOSE IS 56ml. DISCONTINUE YOUR BACTRIM AT THIS TIME.  Repeat chest x-ray and possibly CT scan may be needed to follow these changes and see if there is improvement with treatment. See your doctor tomorrow to review these findings and discuss your continued care.

## 2016-05-22 ENCOUNTER — Other Ambulatory Visit: Payer: Self-pay

## 2016-05-24 ENCOUNTER — Ambulatory Visit (INDEPENDENT_AMBULATORY_CARE_PROVIDER_SITE_OTHER): Payer: Medicare Other | Admitting: Family Medicine

## 2016-05-24 ENCOUNTER — Encounter: Payer: Self-pay | Admitting: Family Medicine

## 2016-05-24 VITALS — BP 109/62 | HR 77 | Temp 97.9°F | Resp 16 | Ht 62.0 in | Wt 93.0 lb

## 2016-05-24 DIAGNOSIS — R9389 Abnormal findings on diagnostic imaging of other specified body structures: Secondary | ICD-10-CM

## 2016-05-24 DIAGNOSIS — R938 Abnormal findings on diagnostic imaging of other specified body structures: Secondary | ICD-10-CM

## 2016-05-24 NOTE — Patient Instructions (Signed)
Follow up as scheduled in May (we'll order the repeat CT at that time) Finish the antibiotics as directed Make sure you are eating regularly- this will prevent upset stomach with the antibiotics Drink plenty of fluids Call with any questions or concerns Happy Holidays!!!

## 2016-05-24 NOTE — Progress Notes (Signed)
   Subjective:    Patient ID: Kara Burnett, female    DOB: 1924/09/17, 82 y.o.   MRN: [id number]  HPI ER f/u- pt was seen 12/3 and UA was highly suspicious of infxn and CT was suggestive of PNA on R side (vs fungal infxn vs neoplasm).  They recommended repeat chest CT in 6 months.  Pt's Bactrim was stopped and she was started on Augmentin and Zpack.  Pt reports feeling 'a little better'.  Ate better on Monday and then yesterday did not eat much.  Pt now having diarrhea that started this AM.  Family is interested in home health referral due to pt's weakness and difficulty w/ mobility- particularly in the setting of recent illness.  Pt is 'not able to get up off the toilet'.  Denies SOB.  Pt feels her strength is improving.  No fevers.  Much less confusion.   Review of Systems For ROS see HPI     Objective:   Physical Exam  Constitutional: She is oriented to person, place, and time. No distress.  Thin, frail  HENT:  Head: Normocephalic and atraumatic.  Eyes: Conjunctivae and EOM are normal. Pupils are equal, round, and reactive to light.  Neck: Normal range of motion. Neck supple. No thyromegaly present.  Cardiovascular: Normal rate and regular rhythm.   Pulmonary/Chest: Effort normal. No respiratory distress. She has no wheezes.  Bowel sounds in chest  Abdominal: Soft. Bowel sounds are normal. She exhibits no distension. There is no tenderness. There is no rebound and no guarding.  Lymphadenopathy:    She has no cervical adenopathy.  Neurological: She is alert and oriented to person, place, and time.  Skin: Skin is warm and dry.  Psychiatric: She has a normal mood and affect. Her behavior is normal. Thought content normal.  Vitals reviewed.         Assessment & Plan:  Abnormal chest CT- new.  Reviewed ER notes and chest CT which showed focal PNA vs fungal infxn vs neoplasm and recommended repeat CT in 6 months w/ contrast.  Pt is on Augmentin and Azithro for suspected PNA.  Pt  and daughter report pt's mental status is improving, weakness is improving.  Pt is not eating regularly which is likely why she is having diarrhea on her Augmentin.  Encouraged regular eating.  No need for repeat WBC or imaging at this time- will address at next visit.  Reviewed supportive care and red flags that should prompt return.  Pt expressed understanding and is in agreement w/ plan.

## 2016-05-24 NOTE — Progress Notes (Signed)
Pre visit review using our clinic review tool, if applicable. No additional management support is needed unless otherwise documented below in the visit note. 

## 2016-05-28 ENCOUNTER — Other Ambulatory Visit: Payer: Self-pay | Admitting: Family Medicine

## 2016-06-25 ENCOUNTER — Other Ambulatory Visit: Payer: Self-pay | Admitting: Family Medicine

## 2016-06-26 NOTE — Telephone Encounter (Signed)
Indication for chronic opioid: Cervical Radiculopathy Medication and dose: Tramadol 50mg  QHS # pills per month: 30 Last UDS date: Unable to locate Pain contract signed (Y/N): Y Date narcotic database last reviewed (include red flags): Database reviewed 06/26/16, no red flags  Ok for #30

## 2016-06-26 NOTE — Telephone Encounter (Signed)
Last OV 05/24/16 Tramadol last filled 05/15/16 #30 with 0

## 2016-07-10 ENCOUNTER — Other Ambulatory Visit: Payer: Self-pay | Admitting: Family Medicine

## 2016-07-17 ENCOUNTER — Telehealth: Payer: Self-pay | Admitting: Family Medicine

## 2016-07-17 NOTE — Telephone Encounter (Signed)
Patient daughter notified of PCP recommendations and is agreement and expresses an understanding.    

## 2016-07-17 NOTE — Telephone Encounter (Signed)
This is most likely dementia as she has a hx of paranoia when it comes to advice from family members and trusting people that she shouldn't.  Unfortunately there is not a lot to do but continue to try- have other family members or friends try, consider getting in-home help, or even possible placement.

## 2016-07-17 NOTE — Telephone Encounter (Signed)
Please advise 

## 2016-07-17 NOTE — Telephone Encounter (Signed)
Daughter is calling about pt stating that pt will not listen to daughter when it comes to taking meds, eating, drinking, etc. Caller states that she she not able to help without a confrontation and thinks that this could be dementia and asking what should she do, caller was upset and crying while talking to me on the phone.

## 2016-07-20 ENCOUNTER — Telehealth: Payer: Self-pay | Admitting: Neurology

## 2016-07-20 NOTE — Telephone Encounter (Signed)
At this point it sounds like her symptoms are worsening that she should not be home alone. Recommend they obtain guardianship so she can be moved to a nursing home. In the meantime, pls have family come in regularly to administer her medications so she is taking them regularly. Pls ask Suzanna if she has any recommendations, I usually give them the information for [place] [facility] for further guidance. Their number is [phone].

## 2016-07-20 NOTE — Telephone Encounter (Signed)
Please advise 

## 2016-07-20 NOTE — Telephone Encounter (Signed)
Advised daughter of below. She states they may try home health services if we wanted to send referral. Suzanna also suggest trying [facility] if that's okay for case manger and Education officer, museum.

## 2016-07-20 NOTE — Telephone Encounter (Signed)
Let's do that, thanks

## 2016-07-20 NOTE — Telephone Encounter (Signed)
Kara Burnett July 15, 2024. Sorry in earlier note I put the wrong last name. Thank you

## 2016-07-20 NOTE — Telephone Encounter (Signed)
Spoke with patients daughter. She states she and her sister are caring for their mother. Every few months she has an episode where her mode changes. Daughter called Dr. Rande Lawman office and they told her to contact us. Daughters have noticed mother has pills in the floor where she is hiding them or she's dropping them. She will sometimes refuse medications. When daughter called her sister a while ago she was cussing and threatening to call police. They don't think pt will allow someone to come in to home to help but daughters need advise on what to do with her. Please advise.

## 2016-07-20 NOTE — Telephone Encounter (Signed)
Tolanda Mielcarek September 04, 2024. Her sister called after receiving a call from her other sister regarding Cherylyn Skillen's behavior. She is very concerned and would like you to please call her back at [phone] her name is [name]. Thank you

## 2016-07-21 ENCOUNTER — Other Ambulatory Visit: Payer: Self-pay

## 2016-07-21 DIAGNOSIS — F0390 Unspecified dementia without behavioral disturbance: Secondary | ICD-10-CM

## 2016-07-21 DIAGNOSIS — F03A0 Unspecified dementia, mild, without behavioral disturbance, psychotic disturbance, mood disturbance, and anxiety: Secondary | ICD-10-CM

## 2016-07-24 NOTE — Telephone Encounter (Signed)
Attempted to contact daughter Helene Kelp on 07/21/16 and 07/24/16, both times line busy.

## 2016-07-26 ENCOUNTER — Other Ambulatory Visit: Payer: Self-pay | Admitting: Family Medicine

## 2016-07-26 NOTE — Telephone Encounter (Signed)
Spoke with patients daughter Helene Kelp. She states things are going better and they had first visit with [facility] yesterday. Advised daughter to call for anything else we can assist with.

## 2016-07-27 NOTE — Telephone Encounter (Signed)
Medication filled to pharmacy as requested.   

## 2016-07-27 NOTE — Telephone Encounter (Signed)
Last OV 05/24/16 Tramadol last filled 06/26/16 #30 with 0

## 2016-07-31 ENCOUNTER — Telehealth: Payer: Self-pay | Admitting: Family Medicine

## 2016-07-31 NOTE — Telephone Encounter (Signed)
[facility] called to advise that the patient has refused their services for 2 weeks in a row. States that she can take care of herself. They just wanted to let us know.

## 2016-07-31 NOTE — Telephone Encounter (Signed)
Please let pt's daughter know that pt is refusing

## 2016-08-01 ENCOUNTER — Other Ambulatory Visit: Payer: Self-pay

## 2016-08-01 ENCOUNTER — Telehealth: Payer: Self-pay

## 2016-08-01 NOTE — Telephone Encounter (Signed)
Patients daughter [name] LVM that home health services came out to start services with patient and she refused them. She told [facility] that she did not need help when the daughters are the ones that set up her medications, give her a bath because she will not get in the shower, and tidy up patients house. When [facility] left yesterday patient started ranting and threatened to call police on daughters for sneaking behind her back. Daughter states she will go from being mad and not trusting them to being sweet and not remembering the things she was saying or doing a hour prior. Daughter asks for more suggestion or what else is advised to help patient.

## 2016-08-01 NOTE — Telephone Encounter (Signed)
Called pt daughter and left a detailed message to inform.

## 2016-08-01 NOTE — Patient Outreach (Signed)
[facility]) Care Management  08/01/2016  Kara Burnett Oct 27, 1924 [id number]   Referral Date:  07/21/2016 Source:  MD [facility] Issue:  Mild dementia and refusing home health services.   Patient threatened to call police on daughters and accussed of sneaking behind patient's back.  Caregivers, daughter asking for more suggestions regarding what else can be done to help patient (per 08/01/2016 MD office note.  PHI:  DPR SIGNED FOR ALL [facility] OK TO DISCLOSE PHI BY PHONE TO [place] SUMMERS DAUGHTER OR [name] OK TO LM ON HOME VM OR CELL VM  DANA LB NEURO     Outreach call #1 to patient.  [phone] Patient not reached.  RN CM left HIPAA compliant voice message with name and number for call back.  RN CM scheduled for next outreach call within one week.    Nathaneil Canary, BSN, RN, [place] Care Management Care Management Coordinator [phone] Direct [phone] Cell [phone] Office [phone] Fax [email or web address]

## 2016-08-02 ENCOUNTER — Other Ambulatory Visit: Payer: Self-pay

## 2016-08-02 NOTE — Patient Outreach (Signed)
[facility]) Care Management  08/02/2016  ANGELYNE CONKLING 12-Sep-1924 [id number]   Referral Date:  07/21/2016 Source:  MD [facility] Issue:  Mild dementia and refusing home health services.   Patient threatened to call police on daughters and accussed of sneaking behind patient's back.  Caregivers, daughter asking for more suggestions regarding what else can be done to help patient (per 08/01/2016 MD office note.  PHI:  DPR SIGNED FOR ALL [facility] OK TO DISCLOSE PHI BY PHONE TO [place] SUMMERS DAUGHTER OR [name] OK TO LM ON HOME VM OR CELL VM  DANA LB NEURO     Outreach call #1 to patient   [phone] Patient not reached.  RN CM left HIPAA compliant voice message with name and number for call back.   Additional same day attempt to other # [phone]; busy tone RN CM scheduled for next outreach call within one week.    Nathaneil Canary, BSN, RN, [place] Management Care Management Coordinator [phone] Direct [phone] Cell [phone] Office [phone] Fax [email or web address]

## 2016-08-03 ENCOUNTER — Ambulatory Visit: Payer: Self-pay

## 2016-08-04 ENCOUNTER — Emergency Department (HOSPITAL_COMMUNITY): Payer: Medicare Other

## 2016-08-04 ENCOUNTER — Ambulatory Visit: Payer: Self-pay

## 2016-08-04 ENCOUNTER — Encounter (HOSPITAL_COMMUNITY): Payer: Self-pay | Admitting: Emergency Medicine

## 2016-08-04 ENCOUNTER — Emergency Department (HOSPITAL_COMMUNITY)
Admission: EM | Admit: 2016-08-04 | Discharge: 2016-08-04 | Disposition: A | Discharge status: Home / Self Care | Payer: Medicare Other | Attending: Emergency Medicine | Admitting: Emergency Medicine

## 2016-08-04 DIAGNOSIS — I10 Essential (primary) hypertension: Secondary | ICD-10-CM | POA: Insufficient documentation

## 2016-08-04 DIAGNOSIS — Y92009 Unspecified place in unspecified non-institutional (private) residence as the place of occurrence of the external cause: Secondary | ICD-10-CM | POA: Diagnosis not present

## 2016-08-04 DIAGNOSIS — Z7982 Long term (current) use of aspirin: Secondary | ICD-10-CM | POA: Insufficient documentation

## 2016-08-04 DIAGNOSIS — Y9389 Activity, other specified: Secondary | ICD-10-CM | POA: Diagnosis not present

## 2016-08-04 DIAGNOSIS — Z79899 Other long term (current) drug therapy: Secondary | ICD-10-CM | POA: Insufficient documentation

## 2016-08-04 DIAGNOSIS — S299XXA Unspecified injury of thorax, initial encounter: Secondary | ICD-10-CM | POA: Diagnosis present

## 2016-08-04 DIAGNOSIS — S22000A Wedge compression fracture of unspecified thoracic vertebra, initial encounter for closed fracture: Secondary | ICD-10-CM

## 2016-08-04 DIAGNOSIS — W1839XA Other fall on same level, initial encounter: Secondary | ICD-10-CM | POA: Insufficient documentation

## 2016-08-04 DIAGNOSIS — Y999 Unspecified external cause status: Secondary | ICD-10-CM | POA: Insufficient documentation

## 2016-08-04 DIAGNOSIS — S22088A Other fracture of T11-T12 vertebra, initial encounter for closed fracture: Secondary | ICD-10-CM | POA: Diagnosis not present

## 2016-08-04 DIAGNOSIS — R52 Pain, unspecified: Secondary | ICD-10-CM

## 2016-08-04 MED ORDER — HYDROCODONE-ACETAMINOPHEN 7.5-325 MG/15ML PO SOLN: 5.0000 mL | ORAL | 0 refills | Status: DC | PRN

## 2016-08-04 MED ORDER — ACETAMINOPHEN 160 MG/5ML PO SOLN
650.0000 mg | Freq: Once | ORAL | Status: AC
  Administered 2016-08-04: 650 mg via ORAL
  Filled 2016-08-04: qty 20.3

## 2016-08-04 NOTE — ED Provider Notes (Signed)
[facility] DEPT Provider Note   CSN: [id number] Arrival date & time: 08/04/16  1941     History   Chief Complaint Chief Complaint  Patient presents with  . Fall    HPI Kara Burnett is a 83 y.o. female.  83 year old female here for mechanical fall while reaching for an object. Did not strike her head did not lose consciousness. Denies any neck pain. Complains of sharp mid thoracic and lumbar spine discomfort worse with movement. Denies any numbness or tingling to her legs. Denies any hip pain at this time. No chest or abdominal discomfort. Pain better with remaining still. No treatment use prior to arrival.      Past Medical History:  Diagnosis Date  . Anemia   . GERD (gastroesophageal reflux disease)   . Herpes zoster   . Hyperlipidemia   . Hypertension   . Osteoporosis   . Retinal vein occlusion 2015   Dr.Sanders    Patient Active Problem List   Diagnosis Date Noted  . Protein-calorie malnutrition, severe ([place]) 10/13/2015  . Edema of right foot 10/13/2015  . Mild dementia 06/15/2015  . Physical exam 04/14/2015  . Paranoia ([place]) 04/14/2015  . Acute bronchitis 04/18/2014  . Absence of bladder continence 04/18/2014  . Atypical nevus of neck 10/01/2013  . Acute upper respiratory infections of unspecified site 10/16/2012  . Bilateral conjunctivitis 10/16/2012  . Chest rales 10/16/2012  . Allergic rhinitis, cause unspecified 10/16/2012  . Hypercalcemia 04/10/2012  . Vitamin D deficiency 05/09/2010  . ANEMIA, MILD 05/09/2010  . RESTLESS LEG SYNDROME 05/09/2010  . FATIGUE 11/16/2009  . POSTURAL LIGHTHEADEDNESS 02/01/2009  . Other specified disease of white blood cells 10/01/2008  . Depression 03/26/2008  . Pernicious anemia 07/05/2007  . CERVICAL RADICULOPATHY 07/05/2007  . Hyperlipidemia 04/06/2007  . Essential hypertension 04/06/2007  . GERD 04/06/2007  . Osteoporosis 04/06/2007    Past Surgical History:  Procedure Laterality Date  . Cataract  surgery     Bilaterally  . CHOLECYSTECTOMY  1990  . COLONOSCOPY  2008   negative  . sacroplasty     post pelvic fracture    OB History    No data available       Home Medications    Prior to Admission medications   Medication Sig Start Date End Date Taking? Authorizing Provider  amitriptyline (ELAVIL) 10 MG tablet TAKE 1 TABLET (10 MG TOTAL) BY MOUTH AT BEDTIME. 03/02/16   Midge Minium, MD  amoxicillin-clavulanate (AUGMENTIN) 250-62.5 MG/5ML suspension Take 10 mLs (500 mg total) by mouth 3 (three) times daily. 05/21/16   Charlesetta Shanks, MD  aspirin 81 MG tablet Take 81 mg by mouth daily.    Historical Provider, MD  benazepril (LOTENSIN) 10 MG tablet Take 1 tablet (10 mg total) by mouth daily. 11/17/15   Midge Minium, MD  donepezil (ARICEPT) 10 MG tablet Take 1 tablet daily 04/25/16   Cameron Sprang, MD  rosuvastatin (CRESTOR) 20 MG tablet TAKE 1 TABLET EVERY DAY 05/29/16   Midge Minium, MD  sertraline (ZOLOFT) 25 MG tablet TAKE 1 TABLET (25 MG TOTAL) BY MOUTH DAILY. 04/14/16   Midge Minium, MD  traMADol (ULTRAM) 50 MG tablet TAKE 1 TABLET BY MOUTH AT BEDTIME AS NEEDED FOR PAIN 07/27/16   Midge Minium, MD  Vitamin D, Ergocalciferol, (DRISDOL) 50000 units CAPS capsule TAKE 1 CAPSULE (50,000 UNITS TOTAL) BY MOUTH EVERY 7 (SEVEN) DAYS. 07/10/16   Midge Minium, MD    Family History Family  History  Problem Relation Age of Onset  . Stroke Father   . Heart disease Father     MI in 57s  . Heart disease Sister     MI in 74s  . Diabetes Sister   . Stroke Mother     coma  . Hypothyroidism Son   . Heart disease Son     Social History Social History  Substance Use Topics  . Smoking status: Never Smoker  . Smokeless tobacco: Never Used  . Alcohol use No     Allergies   Ciprofloxacin   Review of Systems Review of Systems  All other systems reviewed and are negative.    Physical Exam Updated Vital Signs BP 193/75   Pulse 76   Temp 98.8  F (37.1 C) (Oral)   Resp 18   SpO2 96%   Physical Exam  Constitutional: She is oriented to person, place, and time. She appears well-developed and well-nourished.  Non-toxic appearance. No distress.  HENT:  Head: Normocephalic and atraumatic.  Eyes: Conjunctivae, EOM and lids are normal. Pupils are equal, round, and reactive to light.  Neck: Normal range of motion. Neck supple. No tracheal deviation present. No thyroid mass present.  Cardiovascular: Normal rate, regular rhythm and normal heart sounds.  Exam reveals no gallop.   No murmur heard. Pulmonary/Chest: Effort normal and breath sounds normal. No stridor. No respiratory distress. She has no decreased breath sounds. She has no wheezes. She has no rhonchi. She has no rales.  Abdominal: Soft. Normal appearance and bowel sounds are normal. She exhibits no distension. There is no tenderness. There is no rebound and no CVA tenderness.  Musculoskeletal: Normal range of motion. She exhibits no edema or tenderness.       Back:  Neurological: She is alert and oriented to person, place, and time. She has normal strength. No cranial nerve deficit or sensory deficit. GCS eye subscore is 4. GCS verbal subscore is 5. GCS motor subscore is 6.  Skin: Skin is warm and dry. No abrasion and no rash noted.  Psychiatric: She has a normal mood and affect. Her speech is normal and behavior is normal.  Nursing note and vitals reviewed.    ED Treatments / Results  Labs (all labs ordered are listed, but only abnormal results are displayed) Labs Reviewed - No data to display  EKG  EKG Interpretation None       Radiology No results found.  Procedures Procedures (including critical care time)  Medications Ordered in ED Medications  acetaminophen (TYLENOL) solution 650 mg (not administered)     Initial Impression / Assessment and Plan / ED Course  I have reviewed the triage vital signs and the nursing notes.  Pertinent labs & imaging  results that were available during my care of the patient were reviewed by me and considered in my medical decision making (see chart for details).   patient medicated with Tylenol here. Will prescribe hydrocodone liquid and she will follow with her Dr. for vertebral plasty  Final Clinical Impressions(s) / ED Diagnoses   Final diagnoses:  None    New Prescriptions New Prescriptions   No medications on file     Lacretia Leigh, MD 08/04/16 2134

## 2016-08-04 NOTE — ED Triage Notes (Signed)
BIB EMS from home, fell backwards from standing, hit back of head on furniture, no LOC.  Lumbar back pain from fall, no tenderness or bruising noted by EMS.  No anticoagulation.  Dementia at baseline.  184/80 (baseline), 80 HR, 18, CBG 121.

## 2016-08-04 NOTE — ED Notes (Signed)
Patient transported to X-ray 

## 2016-08-07 ENCOUNTER — Telehealth: Payer: Self-pay | Admitting: Family Medicine

## 2016-08-07 DIAGNOSIS — S22009A Unspecified fracture of unspecified thoracic vertebra, initial encounter for closed fracture: Secondary | ICD-10-CM

## 2016-08-07 NOTE — Addendum Note (Signed)
Addended by: Davis Gourd on: 08/07/2016 01:55 PM   Modules accepted: Orders

## 2016-08-07 NOTE — Telephone Encounter (Signed)
There's an ER note stating pt has a vertebral fx and needs a referral for IR to do vertebroplasty

## 2016-08-07 NOTE — Telephone Encounter (Signed)
Please advise? Did you see a note on this?

## 2016-08-07 NOTE — Telephone Encounter (Signed)
Patient fell Friday night and she took patient to ER. ER Zenia Resides, MD said for patient to get referral for MD that can do scarialplasty. Please call daughter [name] at [phone]. Does patient need to come in for a follow up appt?  Thank you

## 2016-08-07 NOTE — Telephone Encounter (Signed)
Orders placed. Pt daughter made aware.

## 2016-08-09 ENCOUNTER — Telehealth: Payer: Self-pay | Admitting: Family Medicine

## 2016-08-09 NOTE — Telephone Encounter (Signed)
Called and left a detailed message for pt daughter on voicemail to inform if she needs pain meds refilled.

## 2016-08-09 NOTE — Telephone Encounter (Signed)
Patient's daughter calling to check status of referral to interventional radiology.  Called interventional radiology to check the status of referral.  Per Jocelyn Lamer, order was received.  However, Angelita Ingles their scheduler has left for the day.  She will be back tomorrow.  Message left on Roberta's voicemail for a call back tomorrow to get patient scheduled.  Patient's daughter states patient is in severe pain and is barely able to move.  She is concerned she will run out of pain medication before she can get in with IR.  She wants to know what she should do in the mean time.

## 2016-08-09 NOTE — Telephone Encounter (Signed)
Opened in error

## 2016-08-09 NOTE — Telephone Encounter (Signed)
We can provide a refill on pain meds if needed but otherwise we must wait for IR to do their intervention

## 2016-08-10 ENCOUNTER — Other Ambulatory Visit: Payer: Self-pay | Admitting: Family Medicine

## 2016-08-10 DIAGNOSIS — S22008A Other fracture of unspecified thoracic vertebra, initial encounter for closed fracture: Secondary | ICD-10-CM

## 2016-08-10 NOTE — Telephone Encounter (Signed)
FYI:  Patient has consult scheduled with IR on 08/16/16.

## 2016-08-10 NOTE — Telephone Encounter (Signed)
FYI

## 2016-08-15 ENCOUNTER — Telehealth: Payer: Self-pay | Admitting: Internal Medicine

## 2016-08-15 ENCOUNTER — Encounter (HOSPITAL_COMMUNITY): Payer: Self-pay | Admitting: Radiology

## 2016-08-15 ENCOUNTER — Ambulatory Visit (HOSPITAL_COMMUNITY)
Admission: RE | Admit: 2016-08-15 | Discharge: 2016-08-15 | Disposition: A | Discharge status: Home / Self Care | Payer: Medicare Other | Source: Ambulatory Visit | Attending: Family Medicine | Admitting: Family Medicine

## 2016-08-15 DIAGNOSIS — M4854XA Collapsed vertebra, not elsewhere classified, thoracic region, initial encounter for fracture: Secondary | ICD-10-CM | POA: Insufficient documentation

## 2016-08-15 DIAGNOSIS — M40294 Other kyphosis, thoracic region: Secondary | ICD-10-CM | POA: Insufficient documentation

## 2016-08-15 DIAGNOSIS — S22009A Unspecified fracture of unspecified thoracic vertebra, initial encounter for closed fracture: Secondary | ICD-10-CM | POA: Diagnosis present

## 2016-08-15 DIAGNOSIS — K449 Diaphragmatic hernia without obstruction or gangrene: Secondary | ICD-10-CM | POA: Diagnosis not present

## 2016-08-15 DIAGNOSIS — I714 Abdominal aortic aneurysm, without rupture: Secondary | ICD-10-CM | POA: Insufficient documentation

## 2016-08-15 DIAGNOSIS — M5124 Other intervertebral disc displacement, thoracic region: Secondary | ICD-10-CM | POA: Diagnosis not present

## 2016-08-15 NOTE — Telephone Encounter (Signed)
Please call IR and ask how best to proceed.  We are trying to do this for pain control and defer to IR how best to do this as I don't usually order these

## 2016-08-15 NOTE — Telephone Encounter (Signed)
Received call from [facility] - MRI performed stat - can not let patient go without doctor's approval.  Advised to send patient home.   Reviewed MRI - known compression fracture seen.  No acute findings seen.  MRI done for IR for possible vertebroplasty.   Did not discuss results with patient or her daughter.

## 2016-08-15 NOTE — Telephone Encounter (Signed)
Per Angelita Ingles at IR a MRI after the injury needs to be completed. Spoke with PCP. STAT imaging order placed today.

## 2016-08-15 NOTE — Addendum Note (Signed)
Addended by: Desmond Dike L on: 08/15/2016 11:12 AM   Modules accepted: Orders

## 2016-08-15 NOTE — Telephone Encounter (Signed)
Just received phone call from Dixon at IR stating patient's appt scheduled for tomorrow will need to be cancelled.  Apparently, patient is not eligible for vertebroplasty since she has not had a recent MRI.  Per review of her chart, the last MRI was done in 2013 and while treated at ER, they only performed x-rays.    Patient will need to have a recent MRI before they can reschedule the patient.  Angelita Ingles is contacting patient's daughter to let her know.  Angelita Ingles can be reached at [phone] with any questions.

## 2016-08-15 NOTE — Telephone Encounter (Signed)
Please advise 

## 2016-08-16 ENCOUNTER — Ambulatory Visit
Admission: RE | Admit: 2016-08-16 | Discharge: 2016-08-16 | Disposition: A | Discharge status: Home / Self Care | Payer: Medicare Other | Source: Ambulatory Visit | Attending: Family Medicine | Admitting: Family Medicine

## 2016-08-16 ENCOUNTER — Other Ambulatory Visit: Payer: Self-pay

## 2016-08-16 ENCOUNTER — Ambulatory Visit: Payer: Self-pay

## 2016-08-16 DIAGNOSIS — S22008A Other fracture of unspecified thoracic vertebra, initial encounter for closed fracture: Secondary | ICD-10-CM

## 2016-08-16 NOTE — Telephone Encounter (Signed)
Please call pt/daughter and let them know that compression fracture was again seen on MRI and we will proceed w/ Vertebroplasty.  If pain does not improve, will need neurosurg referral

## 2016-08-16 NOTE — Telephone Encounter (Signed)
Patient daughter notified of PCP recommendations and is agreement and expresses an understanding.    

## 2016-08-16 NOTE — Telephone Encounter (Signed)
THN Case manager on the case.

## 2016-08-17 ENCOUNTER — Other Ambulatory Visit: Payer: Self-pay

## 2016-08-17 NOTE — Patient Outreach (Signed)
[facility]) Care Management  08/17/2016  Kara Burnett Dec 25, 1924 [id number]   Referral Date:  07/21/2016 Source:  MD Velora Heckler Neurology Issue:  Mild dementia and refusing home health services.   Patient threatened to call police on daughters and accussed of sneaking behind patient's back.  Caregivers, daughter asking for more suggestions regarding what else can be done to help patient (per 08/01/2016 MD office note.  PHI:  DPR SIGNED FOR ALL [facility] OK TO DISCLOSE PHI BY PHONE TO [place] SUMMERS DAUGHTER OR [name] OK TO LM ON HOME VM OR CELL VM  [facility] NEURO     Outreach call #3 to patient   [phone] ([place]) Patient not reached.  RN CM left HIPAA compliant voice message with name and number for call back.   Additional same day attempt to other # [phone]; busy tone RN CM sent unsuccessful outreach letter and will close case if no response over the next 2 weeks.   Nathaneil Canary, BSN, RN, [facility] Care Management Coordinator [phone] Direct [phone] Cell [phone] Office [phone] Fax [email or web address]

## 2016-08-18 ENCOUNTER — Ambulatory Visit
Admission: RE | Admit: 2016-08-18 | Discharge: 2016-08-18 | Disposition: A | Discharge status: Home / Self Care | Payer: Medicare Other | Source: Ambulatory Visit | Attending: Family Medicine | Admitting: Family Medicine

## 2016-08-18 DIAGNOSIS — S22008A Other fracture of unspecified thoracic vertebra, initial encounter for closed fracture: Secondary | ICD-10-CM

## 2016-08-18 MED ORDER — SODIUM CHLORIDE 0.9 % IV SOLN
Freq: Once | INTRAVENOUS | Status: AC
  Administered 2016-08-18: 14:00:00 via INTRAVENOUS

## 2016-08-18 MED ORDER — KETOROLAC TROMETHAMINE 30 MG/ML IJ SOLN
30.0000 mg | Freq: Once | INTRAMUSCULAR | Status: AC
  Administered 2016-08-18: 30 mg via INTRAVENOUS

## 2016-08-18 MED ORDER — MIDAZOLAM HCL 2 MG/2ML IJ SOLN
1.0000 mg | INTRAMUSCULAR | Status: DC | PRN
  Administered 2016-08-18: 0.25 mg via INTRAVENOUS

## 2016-08-18 MED ORDER — FENTANYL CITRATE (PF) 100 MCG/2ML IJ SOLN
25.0000 ug | INTRAMUSCULAR | Status: DC | PRN
  Administered 2016-08-18 (×2): 12.5 ug via INTRAVENOUS

## 2016-08-18 MED ORDER — CEFAZOLIN SODIUM-DEXTROSE 2-4 GM/100ML-% IV SOLN
2.0000 g | Freq: Once | INTRAVENOUS | Status: AC
  Administered 2016-08-18: 2 g via INTRAVENOUS

## 2016-08-18 NOTE — Progress Notes (Signed)
Dr. Jola Baptist into speak to pt's daughter

## 2016-08-18 NOTE — Discharge Instructions (Signed)
Vertebroplasty Post Procedure Discharge Instructions  1. May resume a regular diet and any medications that you routinely take (including pain medications). 2. No driving day of procedure. 3. Upon discharge go home and rest for at least 4 hours.  May use an ice pack as needed to injection sites on back. 4. Remove bandades after shower in morning. Replace dressing with bandaides, change daily until healed. 5. Follow up with primary care physician about need for bone therapy.    Please contact our office at [phone] for the following symptoms:   Fever greater than 100 degrees  Increased swelling, pain, or redness at injection site.   Thank you for visiting [facility].

## 2016-08-29 ENCOUNTER — Other Ambulatory Visit: Payer: Self-pay

## 2016-08-29 NOTE — Patient Outreach (Addendum)
[facility]) Care Management  08/29/2016  Kara Burnett 01/04/1925 [id number]  Telephonic Screening and Initial Assessment   Insurance:  [facility] / [employer]   Referral Date:  07/21/2016 Source:  MD Velora Heckler Neurology Issue:  Mild dementia and refusing home health services.   Patient threatened to call police on daughters and accused of sneaking behind patient's back.  Caregivers, daughter asking for more suggestions regarding what else can be done to help patient (per 08/01/2016 MD office note.   [address]  [place] [address] [phone] (H)  Subjective:  H/o unsuccessful outreach letter mailed 08/17/16.  Call returned to RN CM from patient's daughter, [name].  Daughter states patient has dementia and is HOH; unable to talk on the phone.   Providers: Primary MD: Dr. Urbano Heir  -  last appt:  05/24/16   next appt:  09/2016   Neurologist: (Dementia)   Next appt 10/2016 HH: [facility] - patient refused services. Jan / Feb 2018 last visit.   Psycho/Social: Patient lives in her home alone.  Daughter, Kara Burnett is there during the day while sister, [name] works.  [name] goes after work and gets patient out of recliner and feeds patient.  Kara Burnett states patient has dementia and is confused most of the time.  States patient refuses to take her medications and refuses [facility] services and placement for LTC.  States patient is not safe at home alone but family is unable to provide 24/7 care.  States patient coming to live with daughters is not an option.  States patient is incontinent of urine and sometimes with bowels; wears depends.  States patient has a lot of agitation and will tell people what she thinks in a second.  Mobility: walker with assistance of another person.  Falls:  3-4 falls over the past year.  Last fall prior to Christmas 2017  and messed up a vertebrae.   Injection of "cement" 2 weeks ago. (08/2016).  Pain:  Daughter states they are not giving patient the  pain medicine.  States they fear the patient will get hooked on the medicine.  States patient does not complain of pain and they are not able to see any signs that patient is in pain.   Depression: unable to assess.  Transportation:  family Advance Directive: POA, [name] but no advance directive or HCPOA.   Consent:  DPR SIGNED FOR ALL [facility] OK TO DISCLOSE PHI BY PHONE TO [name] OR [name] OK TO LM ON HOME VM OR CELL VM   DME:  Walker, Depends, dentures, hearing aide, eyeglasses.   Co-morbidities:  HTN, Hyperlipidemia, Vit D deficiency, Paranoia, Dementia, Depression.  Admissions: 0 ER visits: 2  Medications:  Helene Kelp administers medication when patient will take. (Medication Non-adherence)  Patient taking less than 15 medications  Co-pay cost issues: none  Flu Vaccine: 2017   Encounter Medications:  Outpatient Encounter Prescriptions as of 08/29/2016  Medication Sig  . amitriptyline (ELAVIL) 10 MG tablet TAKE 1 TABLET (10 MG TOTAL) BY MOUTH AT BEDTIME. (Patient not taking: Reported on 08/29/2016)  . amoxicillin-clavulanate (AUGMENTIN) 250-62.5 MG/5ML suspension Take 10 mLs (500 mg total) by mouth 3 (three) times daily. (Patient not taking: Reported on 08/29/2016)  . aspirin 81 MG tablet Take 81 mg by mouth daily.  . benazepril (LOTENSIN) 10 MG tablet Take 1 tablet (10 mg total) by mouth daily. (Patient not taking: Reported on 08/29/2016)  . donepezil (ARICEPT) 10 MG tablet Take 1  tablet daily (Patient not taking: Reported on 08/29/2016)  . HYDROcodone-acetaminophen (HYCET) 7.5-325 mg/15 ml solution Take 5 mLs by mouth every 4 (four) hours as needed for moderate pain or severe pain. (Patient not taking: Reported on 08/29/2016)  . rosuvastatin (CRESTOR) 20 MG tablet TAKE 1 TABLET EVERY DAY (Patient not taking: Reported on 08/29/2016)  . sertraline (ZOLOFT) 25 MG tablet TAKE 1 TABLET (25 MG TOTAL) BY MOUTH DAILY. (Patient not taking: Reported on 08/29/2016)  .  traMADol (ULTRAM) 50 MG tablet TAKE 1 TABLET BY MOUTH AT BEDTIME AS NEEDED FOR PAIN (Patient not taking: Reported on 08/29/2016)  . Vitamin D, Ergocalciferol, (DRISDOL) 50000 units CAPS capsule TAKE 1 CAPSULE (50,000 UNITS TOTAL) BY MOUTH EVERY 7 (SEVEN) DAYS. (Patient not taking: Reported on 08/29/2016)   No facility-administered encounter medications on file as of 08/29/2016.     Functional Status:  In your present state of health, do you have any difficulty performing the following activities: 08/29/2016 04/19/2016  Hearing? [name]  Vision? N N  Difficulty concentrating or making decisions? Y N  Walking or climbing stairs? Y N  Dressing or bathing? Y N  Doing errands, shopping? Y N  Preparing Food and eating ? Y Y  Using the Toilet? N N  In the past six months, have you accidently leaked urine? Y Y  Do you have problems with loss of bowel control? Y N  Managing your Medications? Y Y  Managing your Finances? [name]  Housekeeping or managing your Housekeeping? [name]  Some recent data might be hidden    Fall/Depression Screening: PHQ 2/9 Scores 08/29/2016 04/19/2016 04/14/2015 10/28/2014 10/01/2013  PHQ - 2 Score 0 1 1 0 0    Fall Risk  08/29/2016 04/25/2016 04/19/2016 04/14/2015 10/28/2014  Falls in the past year? Yes No No No No  Number falls in past yr: 2 or more - - - -  Injury with Fall? Yes - - - -  Risk for fall due to : History of fall(s);Impaired balance/gait;Impaired mobility - - - -  Follow up Falls evaluation completed;Education provided;Falls prevention discussed - - - -     Assessment / Plan:  Referral Date:  07/21/2016 Source:  MD Corinda [facility] Neurology Issue:  Mild dementia and refusing home health services.   Patient threatened to call police on daughters and accused of sneaking behind patient's back.  Caregivers, daughter asking for more suggestions regarding what else can be done to help patient (per 08/01/2016 MD office note.  Screening and Initial Assessment:  08/29/2016 Program:   Dementia 08/29/16  Dementia -RN CM will provide dementia education and management.   Fall Prevention / Home Safety -RN CM encouraged 24/7 care due to patient with dementia.  -RN CM encouraged placement to meet safety needs if unable to meet in the home with 24/7 care.  -RN CM encouraged to discuss patients care needs with physician who may be able to have open discussion with patient regarding placement/safety needs and medication adherence.   [facility] Education (mailed 08/29/16) -Dementia (including Alzheimer Disease) -Evaluating Memory And Thinking Problems -Mild Cognitive Impairment -Preventing Falls In The Older Adult -[facility]N Calendar   [facility] Social Work Referral (08/29/2016) -Resources for LTC resources in the home -Resources for LTC placement and guidance -Advance Directives - none (patient with dementia)  [facility] Referral  (08/29/2016) -Medication non-adherence Patient not taking medications; refusing meds.  Daughter unable to get patient to take meds.   RN CM advised in next [facility] scheduled contact call within  next 30 days for monthly assessment and / or  care coordination services as needed.  RN CM advised to please notify MD of any changes in condition prior to scheduled appt's.   RN CM provided contact name and # [phone] or main office # [phone] and 24-hour nurse line # [phone].  RN CM confirmed patient is aware of 911 services for urgent emergency needs.  RN CM sent successful outreach letter and  [facility] Introductory package. RN CM sent Physician Enrollment/Barriers Letter and Initial Assessment to Primary MD RN CM notified [facility] Care Management Assistant: agreed to services/case opened.  [facility] CM Care Plan Problem One   Flowsheet Row Most Recent Value  Care Plan Problem One  LTC needs,  patient and family need guidance.   Role Documenting the Problem One  Care Management Telephonic Coordinator  Care Plan for Problem One  Active  THN Long Term Goal (31-90 days)   Patient will obtain care needed in the home or assistance with placement over the next 31-90 days.   THN Long Term Goal Start Date  08/29/16  Interventions for Problem One Long Term Goal  RN CM will provide education on patient getting her level of care needs met either in the home or the LTC facility over the next 31 - 90 days.   THN CM Short Term Goal #1 (0-30 days)  Patient will engage with [facility] SW for assistance with LTC options of care over the next 30 days.   THN CM Short Term Goal #1 Start Date  08/29/16  Interventions for Short Term Goal #1  RN CM will send [facility] SW referral over the next 30 days.   THN CM Short Term Goal #2 (0-30 days)  Patient will report no safety issues / falls over the next 30 days.   THN CM Short Term Goal #2 Start Date  08/29/16  Interventions for Short Term Goal #2  RN CM will provide education on the importance of 24/7 care to meet patient's needs and send SW referral over the next 30 days.    [facility] CM Care Plan Problem Two   Flowsheet Row Most Recent Value  Care Plan Problem Two  Medication non-adherence:  patient refuses medications.   Role Documenting the Problem Two  Care Management Telephonic Coordinator  Care Plan for Problem Two  Active  THN CM Short Term Goal #1 (0-30 days)  Patient will improve with medication adherence over the next 30 days.   THN CM Short Term Goal #1 Start Date  08/29/16  Interventions for Short Term Goal #2   RN CM will provide education on importance of taking medications as ordered over the next 30 days.   THN CM Short Term Goal #2 (0-30 days)  Patient will engage with [facility] Pharmacist over the next 30 days.   THN CM Short Term Goal #2 Start Date  08/29/16  Interventions for Short Term Goal #2  RN CM will send [facility] Pharmacist referral over the next 30 days.     [facility] CM Care Plan Problem Three   Flowsheet Row Most Recent Value  Care Plan Problem Three  Advance Directives:  none   Role Documenting the Problem Three  Care Management  Telephonic Coordinator  Care Plan for Problem Three  Active  THN CM Short Term Goal #1 (0-30 days)  Patient will obtain education on importance of Advance Directives over the next 30 days.   THN CM Short Term Goal #1 Start Date  08/29/16  Interventions  for Short Term Goal #1  RN CM will encourage patient/family discussion with MD due to patient with demential and unable to complete Advance Directive at this time.        Nathaneil Canary, BSN, RN, [place] Care Management Care Management Coordinator [phone] Direct [phone] Cell [phone] Office [phone] Fax [email or web address]

## 2016-08-30 ENCOUNTER — Other Ambulatory Visit: Payer: Self-pay | Admitting: Licensed Clinical Social Worker

## 2016-08-30 ENCOUNTER — Encounter: Payer: Self-pay | Admitting: Licensed Clinical Social Worker

## 2016-08-30 NOTE — Patient Outreach (Signed)
[name]) Care Management  08/30/2016  Kara Burnett May 12, 1925 [id number]   Assessment- CSW received new referral on patient on 08/30/16. Patient had mild dementia and is refusing both HH and SNF placement. Patient threatened to call police on daughter's and accused them of "sneaking behind patient's back." Caregivers (daughters) are requesting more resources and suggestions in regards to what else can be done to help patient. CSW completed initial outreach call to daughter Kara Burnett but was unable to reach her. HIPPA compliant voice message was left.  Plan-CSW will await for return call or complete additional outreach within one week. CSW will send involvement letter to PCP.  Eula Fried, BSW, [email or web address], [email or web address] Phone: [phone] Fax: [phone]

## 2016-08-31 ENCOUNTER — Other Ambulatory Visit: Payer: Self-pay | Admitting: Licensed Clinical Social Worker

## 2016-08-31 ENCOUNTER — Ambulatory Visit: Payer: Self-pay

## 2016-08-31 NOTE — Patient Outreach (Signed)
  [facility]) Care Management  08/31/2016  ANAJA MONTS February 14, 1925 [id number]   Assessment- CSW received return call from patient's daughter [name]. CSW introduced self, reason for call and of [facility] social work services. Family agreeable to social work support. Daughter [name] reports that patient is very non compliant and is not willing to to take her medications, refused [place] services, refused LTC placement, will not use her hearing aides, will not shower, etc. Daughter states "She won't do anything that she is suppose to. She often cusses [place] out even though we are trying to help her." Daughter reports that patient's main two concerns is her dementia and her having recent falls. Daughter shares that patient is not interested in leaving the house and does not have any source of socialization. Daughter expresses frustrations with being patient's caregiver stating "She won't do anything for herself is but is capable of doing more. She just wants [place] to wait on her hand and foot." [name] reports that she and her sister are both caregivers to patient. She shares that she comes daily in the morning until the afternoon and her sister comes in the evenings after she has got off work. [name] reports that she prepares all of her meals. [name] shares that she has her own health concerns and it is becoming more difficult to care for patient due to her being so non compliant stating "It's her way or no way." Helene Kelp (daughter) is the POA but there is no advance directives or HCPOA at this time.  CSW questioned if they have discussed LTC placement with patient and she reports that patient is "Absolutely not interested in going to a nursing home and she will cuss you out if you bring it up." CSW questioned if an APS report has ever been filed and she declined. Family is interested in gaining resource information in regards to : gaining more assistance in the home, senior centers, and caregiver burnout resources.    CSW scheduled home visit for 09/05/16. Family is agreeable to [place] brining along [facility] RNCM Juanita Futrell.   Plan-CSW will complete home visit next week and provide community resources to assist patient and family.  Eula Fried, BSW, MSW, [email or web address] Phone: [phone] Fax: [phone]

## 2016-09-01 ENCOUNTER — Other Ambulatory Visit: Payer: Self-pay | Admitting: Pharmacist

## 2016-09-01 NOTE — Patient Outreach (Signed)
[facility]) Care Management  09/01/2016  Kara Burnett [birth year] [id number]   Patient's daughters were called regarding medication management per referral from [place] Nurse Case Manager, Crystal Hutchinson.  No answer from either daughter Kara Burnett--mail box full), Kara Burnett-left HIPAA compliant message on voicemail).  Plan:  Recall patient's daughters within 2-4 business days.   Elayne Guerin, PharmD, [place] Clinical Pharmacist [phone]

## 2016-09-01 NOTE — Patient Outreach (Signed)
[facility]) Care Management  09/01/2016  ANGELEE BAHR [birth year] [id number]   Patient's daughter Roderick Pee called back. HIPAA identifiers were obtained. She expressed interest in having pharmacist attend the previously scheduled home visit with [place] Worker, Eula Fried.  Plan:  Pharmacist will attend home visit on 09/05/16.  Elayne Guerin, PharmD, [place] Clinical Pharmacist [phone]

## 2016-09-04 ENCOUNTER — Ambulatory Visit: Payer: Medicare Other | Admitting: Pharmacist

## 2016-09-05 ENCOUNTER — Encounter: Payer: Self-pay | Admitting: Licensed Clinical Social Worker

## 2016-09-05 ENCOUNTER — Encounter (HOSPITAL_COMMUNITY): Payer: Self-pay | Admitting: Emergency Medicine

## 2016-09-05 ENCOUNTER — Telehealth: Payer: Self-pay | Admitting: *Deleted

## 2016-09-05 ENCOUNTER — Other Ambulatory Visit: Payer: Self-pay | Admitting: Licensed Clinical Social Worker

## 2016-09-05 ENCOUNTER — Emergency Department (HOSPITAL_COMMUNITY)
Admission: EM | Admit: 2016-09-05 | Discharge: 2016-09-09 | Disposition: A | Discharge status: Home / Self Care | Payer: Medicare Other | Attending: Emergency Medicine | Admitting: Emergency Medicine

## 2016-09-05 ENCOUNTER — Emergency Department (HOSPITAL_COMMUNITY): Payer: Medicare Other

## 2016-09-05 ENCOUNTER — Ambulatory Visit: Payer: Self-pay | Admitting: Pharmacist

## 2016-09-05 DIAGNOSIS — Z046 Encounter for general psychiatric examination, requested by authority: Secondary | ICD-10-CM | POA: Diagnosis not present

## 2016-09-05 DIAGNOSIS — I10 Essential (primary) hypertension: Secondary | ICD-10-CM | POA: Diagnosis not present

## 2016-09-05 DIAGNOSIS — R45850 Homicidal ideations: Secondary | ICD-10-CM | POA: Diagnosis present

## 2016-09-05 DIAGNOSIS — F0390 Unspecified dementia without behavioral disturbance: Secondary | ICD-10-CM | POA: Diagnosis not present

## 2016-09-05 DIAGNOSIS — F329 Major depressive disorder, single episode, unspecified: Secondary | ICD-10-CM | POA: Diagnosis present

## 2016-09-05 DIAGNOSIS — F32A Depression, unspecified: Secondary | ICD-10-CM | POA: Diagnosis present

## 2016-09-05 DIAGNOSIS — Z79899 Other long term (current) drug therapy: Secondary | ICD-10-CM | POA: Insufficient documentation

## 2016-09-05 DIAGNOSIS — Z7982 Long term (current) use of aspirin: Secondary | ICD-10-CM | POA: Diagnosis not present

## 2016-09-05 DIAGNOSIS — R4182 Altered mental status, unspecified: Secondary | ICD-10-CM | POA: Diagnosis not present

## 2016-09-05 DIAGNOSIS — R791 Abnormal coagulation profile: Secondary | ICD-10-CM | POA: Diagnosis not present

## 2016-09-05 LAB — URINALYSIS, ROUTINE W REFLEX MICROSCOPIC
BILIRUBIN URINE: NEGATIVE
Glucose, UA: NEGATIVE mg/dL
Hgb urine dipstick: NEGATIVE
KETONES UR: NEGATIVE mg/dL
Nitrite: NEGATIVE
Protein, ur: NEGATIVE mg/dL
SPECIFIC GRAVITY, URINE: 1.01 (ref 1.005–1.030)
pH: 9 — ABNORMAL HIGH (ref 5.0–8.0)

## 2016-09-05 LAB — CBC WITH DIFFERENTIAL/PLATELET
BASOS ABS: 0 10*3/uL (ref 0.0–0.1)
Basophils Relative: 0 %
EOS ABS: 0 10*3/uL (ref 0.0–0.7)
EOS PCT: 0 %
HCT: 37 % (ref 36.0–46.0)
Hemoglobin: 11.8 g/dL — ABNORMAL LOW (ref 12.0–15.0)
Lymphocytes Relative: 24 %
Lymphs Abs: 1.6 10*3/uL (ref 0.7–4.0)
MCH: 31.1 pg (ref 26.0–34.0)
MCHC: 31.9 g/dL (ref 30.0–36.0)
MCV: 97.6 fL (ref 78.0–100.0)
MONO ABS: 0.3 10*3/uL (ref 0.1–1.0)
Monocytes Relative: 4 %
NEUTROS ABS: 4.9 10*3/uL (ref 1.7–7.7)
Neutrophils Relative %: 72 %
Platelets: 266 10*3/uL (ref 150–400)
RBC: 3.79 MIL/uL — ABNORMAL LOW (ref 3.87–5.11)
RDW: 14.7 % (ref 11.5–15.5)
WBC: 6.9 10*3/uL (ref 4.0–10.5)

## 2016-09-05 LAB — COMPREHENSIVE METABOLIC PANEL
ALBUMIN: 3.6 g/dL (ref 3.5–5.0)
ALT: 8 U/L — ABNORMAL LOW (ref 14–54)
ANION GAP: 9 (ref 5–15)
AST: 21 U/L (ref 15–41)
Alkaline Phosphatase: 49 U/L (ref 38–126)
BUN: 12 mg/dL (ref 6–20)
CALCIUM: 9.8 mg/dL (ref 8.9–10.3)
CO2: 30 mmol/L (ref 22–32)
Chloride: 100 mmol/L — ABNORMAL LOW (ref 101–111)
Creatinine, Ser: 1.01 mg/dL — ABNORMAL HIGH (ref 0.44–1.00)
GFR calc Af Amer: 55 mL/min — ABNORMAL LOW (ref >60)
GFR, EST NON AFRICAN AMERICAN: 48 mL/min — AB (ref >60)
GLUCOSE: 89 mg/dL (ref 65–99)
POTASSIUM: 3.9 mmol/L (ref 3.5–5.1)
SODIUM: 139 mmol/L (ref 135–145)
TOTAL PROTEIN: 5.9 g/dL — AB (ref 6.5–8.1)
Total Bilirubin: 0.6 mg/dL (ref 0.3–1.2)

## 2016-09-05 LAB — ETHANOL

## 2016-09-05 LAB — RAPID URINE DRUG SCREEN, HOSP PERFORMED
Amphetamines: NOT DETECTED
BARBITURATES: NOT DETECTED
BENZODIAZEPINES: NOT DETECTED
COCAINE: NOT DETECTED
OPIATES: NOT DETECTED
Tetrahydrocannabinol: NOT DETECTED

## 2016-09-05 LAB — PROTIME-INR
INR: 1.02
PROTHROMBIN TIME: 13.5 s (ref 11.4–15.2)

## 2016-09-05 LAB — ACETAMINOPHEN LEVEL: Acetaminophen (Tylenol), Serum: <10 ug/mL — ABNORMAL LOW (ref 10–30)

## 2016-09-05 LAB — SALICYLATE LEVEL: Salicylate Lvl: <7 mg/dL (ref 2.8–30.0)

## 2016-09-05 MED ORDER — HALOPERIDOL LACTATE 5 MG/ML IJ SOLN
5.0000 mg | Freq: Once | INTRAMUSCULAR | Status: AC
  Administered 2016-09-05: 5 mg via INTRAMUSCULAR
  Filled 2016-09-05: qty 1

## 2016-09-05 NOTE — ED Notes (Signed)
PT did not have to use RR at this time  
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
## 2016-09-05 NOTE — ED Notes (Signed)
Patient transported to CT 

## 2016-09-05 NOTE — Telephone Encounter (Signed)
Pt daughter notified of PCP recommendations and stated an understanding. She is going to call EMS as soon as she gets home today. PT daughter is afraid that EMS will not transport due to pt acting out. I told her to be adamant that pt is a harm to herself and others.
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
## 2016-09-05 NOTE — ED Triage Notes (Addendum)
Patient from home with [facility].  Per [facility], family called 82 because patient with history of dementia has become more combative over the last few weeks and they want to have her placed in a nursing home.  Patient is alert and oriented to self.  Patient received 2.5mg  IM haldol en route.  Patient frequently complains that she has not eaten breakfast or taken her medicine.
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
## 2016-09-05 NOTE — Telephone Encounter (Signed)
Daughter needs to call EMS and meet them at the house, insisting they transport mother b/c she has dementia and is currently a risk to herself and others.  Once she is in the ER, she can be placed in a facility or admitted until placement can be found.  Transport to the ER is not optional at this time b/c pt doesn't have the mental capacity to refuse assessment.

## 2016-09-05 NOTE — BH Assessment (Signed)
[name] NP recommends.am psych eval and social work consult once dispositioned

## 2016-09-06 DIAGNOSIS — Z7982 Long term (current) use of aspirin: Secondary | ICD-10-CM

## 2016-09-06 DIAGNOSIS — Z79899 Other long term (current) drug therapy: Secondary | ICD-10-CM | POA: Diagnosis not present

## 2016-09-06 DIAGNOSIS — Z046 Encounter for general psychiatric examination, requested by authority: Secondary | ICD-10-CM | POA: Diagnosis not present

## 2016-09-06 DIAGNOSIS — F0390 Unspecified dementia without behavioral disturbance: Secondary | ICD-10-CM | POA: Diagnosis not present

## 2016-09-06 DIAGNOSIS — R45850 Homicidal ideations: Secondary | ICD-10-CM

## 2016-09-06 MED ORDER — DONEPEZIL HCL 5 MG PO TABS
10.0000 mg | ORAL_TABLET | Freq: Every day | ORAL | Status: DC
  Administered 2016-09-08: 10 mg via ORAL
  Filled 2016-09-06: qty 2

## 2016-09-06 MED ORDER — ASPIRIN 81 MG PO CHEW
81.0000 mg | CHEWABLE_TABLET | Freq: Every day | ORAL | Status: DC
  Administered 2016-09-07 – 2016-09-09 (×3): 81 mg via ORAL
  Filled 2016-09-06 (×4): qty 1

## 2016-09-06 MED ORDER — BENAZEPRIL HCL 10 MG PO TABS
10.0000 mg | ORAL_TABLET | Freq: Every day | ORAL | Status: DC
  Administered 2016-09-07 – 2016-09-09 (×3): 10 mg via ORAL
  Filled 2016-09-06 (×4): qty 1

## 2016-09-06 MED ORDER — TRAMADOL HCL 50 MG PO TABS
50.0000 mg | ORAL_TABLET | Freq: Four times a day (QID) | ORAL | Status: DC
  Administered 2016-09-06 – 2016-09-09 (×3): 50 mg via ORAL
  Filled 2016-09-06 (×4): qty 1

## 2016-09-06 MED ORDER — SERTRALINE HCL 50 MG PO TABS
25.0000 mg | ORAL_TABLET | Freq: Every day | ORAL | Status: DC
  Administered 2016-09-07 – 2016-09-09 (×3): 25 mg via ORAL
  Filled 2016-09-06 (×4): qty 1

## 2016-09-06 MED ORDER — VITAMIN D (ERGOCALCIFEROL) 1.25 MG (50000 UNIT) PO CAPS
50000.0000 [IU] | ORAL_CAPSULE | ORAL | Status: DC
  Administered 2016-09-07: 50000 [IU] via ORAL
  Filled 2016-09-06: qty 1

## 2016-09-06 MED ORDER — AMITRIPTYLINE HCL 10 MG PO TABS
10.0000 mg | ORAL_TABLET | Freq: Every day | ORAL | Status: DC
Start: 2016-09-06 — End: 2016-09-09
  Administered 2016-09-08: 10 mg via ORAL
  Filled 2016-09-06 (×5): qty 1

## 2016-09-06 MED ORDER — ROSUVASTATIN CALCIUM 10 MG PO TABS
20.0000 mg | ORAL_TABLET | Freq: Every day | ORAL | Status: DC
  Administered 2016-09-07 – 2016-09-09 (×3): 20 mg via ORAL
  Filled 2016-09-06 (×4): qty 2
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
## 2016-09-06 NOTE — ED Provider Notes (Signed)
Resting comfortably in bed. Denies complaint.   Orlie Dakin, MD 09/06/16 1744

## 2016-09-06 NOTE — ED Notes (Signed)
Patient c/o pain in legs. Tramadol administered. Patient pleasant. Refuses other medications.

## 2016-09-06 NOTE — ED Notes (Signed)
Pt requested orange juice and lemon cookies for snack. Pt given the same

## 2016-09-06 NOTE — ED Notes (Signed)
Patient states she does believe the medications she is being provided are hers, and does not believe the pills are actually the drugs that are written on the package labels.  Patient refuses medications.

## 2016-09-07 ENCOUNTER — Other Ambulatory Visit: Payer: Self-pay | Admitting: Licensed Clinical Social Worker

## 2016-09-07 LAB — URINE CULTURE

## 2016-09-07 NOTE — ED Notes (Signed)
A Regular Diet Has Been Ordered for Lunch.

## 2016-09-07 NOTE — ED Notes (Signed)
Regular Diet Ordered For [employer]

## 2016-09-07 NOTE — Progress Notes (Signed)
CSW received verbal consult on today at 09:16AM by bedside RN that Patient has been cleared by psych and social work to seek placement. CSW has completed FL-2 and made referrals to local SNFs. RN Case Manager has placed PT consult. CSW to update family. CSW continues to follow.    Lorrine Kin, MSW, LCSW [facility] ED/30M Clinical Social Worker [phone]

## 2016-09-07 NOTE — Progress Notes (Signed)
Patient has been declined at 8 SNF facilities, 0 bed offers, and 5 pending facilities. CSW continues to follow.    Lorrine Kin, MSW, LCSW [facility] ED/52M Clinical Social Worker [phone]

## 2016-09-07 NOTE — Progress Notes (Signed)
CSW attempted Patient's daughter, Ladona Ridgel via T/C, No answer and voicemail box full. CSW attempted Patient's other daughter, Roderick Pee, HIPAA compliant voice message left requesting return phone call.    Lorrine Kin, MSW, LCSW [facility] ED/55M Clinical Social Worker [phone]

## 2016-09-07 NOTE — NC FL2 (Signed)
[place] LEVEL OF CARE SCREENING TOOL     IDENTIFICATION  Patient Name: Kara Burnett Birthdate: August 05, 1924 Sex: female Admission Date (Current Location): 09/05/2016  [place] and [place] Number:  Kathleen Argue [id number] [place] and Address:  [facility] [employer] [place] [address], [place], [place] [address]      Provider Number: [id number]  Attending Physician Name and Address:  No att. providers found  Relative Name and Phone Number:  Ladona Ridgel (Daughter) [phone]    Current Level of Care: Hospital Recommended Level of Care: [place], Memory Care Prior Approval Number:    Date Approved/Denied:   PASRR Number:    Discharge Plan: SNF    Current Diagnoses: Patient Active Problem List   Diagnosis Date Noted  . Homicidal ideation   . Involuntary commitment   . Protein-calorie malnutrition, severe ([place]) 10/13/2015  . Edema of right foot 10/13/2015  . Dementia 06/15/2015  . Physical exam 04/14/2015  . Paranoia ([place]) 04/14/2015  . Acute bronchitis 04/18/2014  . Absence of bladder continence 04/18/2014  . Atypical nevus of neck 10/01/2013  . Acute upper respiratory infections of unspecified site 10/16/2012  . Bilateral conjunctivitis 10/16/2012  . Chest rales 10/16/2012  . Allergic rhinitis, cause unspecified 10/16/2012  . Hypercalcemia 04/10/2012  . Vitamin D deficiency 05/09/2010  . ANEMIA, MILD 05/09/2010  . RESTLESS LEG SYNDROME 05/09/2010  . FATIGUE 11/16/2009  . POSTURAL LIGHTHEADEDNESS 02/01/2009  . Other specified disease of white blood cells 10/01/2008  . Depression 03/26/2008  . Pernicious anemia 07/05/2007  . CERVICAL RADICULOPATHY 07/05/2007  . Hyperlipidemia 04/06/2007  . Essential hypertension 04/06/2007  . GERD 04/06/2007  . Osteoporosis 04/06/2007    Orientation RESPIRATION BLADDER Height & Weight     Self  Normal Continent Weight:   Height:     BEHAVIORAL SYMPTOMS/MOOD NEUROLOGICAL  BOWEL NUTRITION STATUS     (NONE) Continent Diet  AMBULATORY STATUS COMMUNICATION OF NEEDS Skin   Independent Verbally Normal                       Personal Care Assistance Level of Assistance  Bathing, Feeding, Dressing Bathing Assistance: Independent Feeding assistance: Independent Dressing Assistance: Independent     Functional Limitations Info  Sight, Hearing, Speech Sight Info: Adequate Hearing Info: Adequate Speech Info: Adequate    SPECIAL CARE FACTORS FREQUENCY  PT (By licensed PT), OT (By licensed OT)                    Contractures Contractures Info: Not present    Additional Factors Info  Code Status, Allergies, Psychotropic Code Status Info: Not of File Allergies Info: Ciprofloxacin Psychotropic Info: Zoloft         Current Medications (09/07/2016):  This is the current hospital active medication list Current Facility-Administered Medications  Medication Dose Route Frequency Provider Last Rate Last Dose  . amitriptyline (ELAVIL) tablet 10 mg  10 mg Oral QHS Margarita Mail, PA-C      . aspirin chewable tablet 81 mg  81 mg Oral Daily Margarita Mail, PA-C      . benazepril (LOTENSIN) tablet 10 mg  10 mg Oral Daily Margarita Mail, PA-C      . donepezil (ARICEPT) tablet 10 mg  10 mg Oral QHS Margarita Mail, PA-C      . rosuvastatin (CRESTOR) tablet 20 mg  20 mg Oral Daily Abigail Harris, PA-C      . sertraline (ZOLOFT) tablet 25 mg  25  mg Oral Daily Margarita Mail, PA-C      . traMADol (ULTRAM) tablet 50 mg  50 mg Oral Q6H Margarita Mail, PA-C   Stopped at 09/07/16 [phone]  . Vitamin D (Ergocalciferol) (DRISDOL) capsule 50,000 Units  50,000 Units Oral Weekly Margarita Mail, PA-C       Current Outpatient Prescriptions  Medication Sig Dispense Refill  . amitriptyline (ELAVIL) 10 MG tablet TAKE 1 TABLET (10 MG TOTAL) BY MOUTH AT BEDTIME. 90 tablet 1  . aspirin 81 MG tablet Take 81 mg by mouth daily.    Marland Kitchen aspirin EC 81 MG tablet Take 81 mg by mouth daily.     . benazepril (LOTENSIN) 10 MG tablet Take 1 tablet (10 mg total) by mouth daily. 90 tablet 1  . Cholecalciferol 2000 units CAPS Take 2,000 Units by mouth daily.    Marland Kitchen donepezil (ARICEPT) 10 MG tablet Take 1 tablet daily 30 tablet 11  . ergocalciferol (VITAMIN D2) 50000 units capsule Take 50,000 Units by mouth once a week.    . rosuvastatin (CRESTOR) 20 MG tablet TAKE 1 TABLET EVERY DAY 90 tablet 1  . sertraline (ZOLOFT) 25 MG tablet TAKE 1 TABLET (25 MG TOTAL) BY MOUTH DAILY. 90 tablet 1  . traMADol (ULTRAM) 50 MG tablet TAKE 1 TABLET BY MOUTH AT BEDTIME AS NEEDED FOR PAIN 30 tablet 0  . Vitamin D, Ergocalciferol, (DRISDOL) 50000 units CAPS capsule TAKE 1 CAPSULE (50,000 UNITS TOTAL) BY MOUTH EVERY 7 (SEVEN) DAYS. 12 capsule 0     Discharge Medications: Please see discharge summary for a list of discharge medications.  Relevant Imaging Results:  Relevant Lab Results:   Additional Information SS #[id number]  Lind Covert, LCSW

## 2016-09-07 NOTE — ED Notes (Signed)
A Regular Diet taken for Lunch.

## 2016-09-08 NOTE — Progress Notes (Signed)
CSW engaged with Patient's daughter at Patient's bedside. CSW discussed concerns with inability to locate SNF facility that will take Patient due to Patient presenting under. Patient's family continues to report that they are unable to provide 24 hr supervision needed for Patient and reports that Patient lives at home alone. CSW has staffed with [place] and resent referral noting that Patient's family is willing to pay privately. CSW continues to follow for dispo.    Lorrine Kin, MSW, LCSW [facility] ED/15M Clinical Social Worker [phone]

## 2016-09-08 NOTE — Evaluation (Signed)
Physical Therapy Evaluation Patient Details Name: Kara Burnett MRN: [id number] DOB: [birth year] Today's Date: 09/08/2016   History of Present Illness  Pt adm with AMS, agitation, combativeness. PMH - dementia, HTN, osteoporosis.   Clinical Impression  Pt presents to PT with weakness and unsteady gait. Prior to admission pt lived alone. Feel pt could benefit from further PT at SNF. Will defer further PT to SNF.     Follow Up Recommendations SNF    Equipment Recommendations  None recommended by PT    Recommendations for Other Services       Precautions / Restrictions Precautions Precautions: Fall Restrictions Weight Bearing Restrictions: No      Mobility  Bed Mobility Overal bed mobility: Needs Assistance Bed Mobility: Supine to Sit;Sit to Supine     Supine to sit: Min assist Sit to supine: Supervision   General bed mobility comments: assist to elevate trunk and bring hips to EOB  Transfers Overall transfer level: Needs assistance Equipment used: 1 person hand held assist Transfers: Sit to/from Stand Sit to Stand: Min assist         General transfer comment: assist for balance  Ambulation/Gait Ambulation/Gait assistance: Min assist Ambulation Distance (Feet): 120 Feet Assistive device: 1 person hand held assist Gait Pattern/deviations: Step-through pattern;Decreased step length - right;Decreased step length - left;Shuffle;Trunk flexed;Narrow base of support Gait velocity: decr Gait velocity interpretation: <1.8 ft/sec, indicative of risk for recurrent falls General Gait Details: Pt with unsteady gait requiring assist for balance  Stairs            Wheelchair Mobility    Modified Rankin (Stroke Patients Only)       Balance Overall balance assessment: Needs assistance Sitting-balance support: No upper extremity supported;Feet supported Sitting balance-Leahy Scale: Good     Standing balance support: Single extremity supported Standing  balance-Leahy Scale: Poor Standing balance comment: Requires UE support for static standing                             Pertinent Vitals/Pain Pain Assessment: No/denies pain    Home Living Family/patient expects to be discharged to:: Skilled nursing facility Living Arrangements: Alone                    Prior Function Level of Independence: Needs assistance   Gait / Transfers Assistance Needed: Pt reports she amb modified independently. Uses rolling walker at times but not always           Hand Dominance        Extremity/Trunk Assessment   Upper Extremity Assessment Upper Extremity Assessment: Generalized weakness    Lower Extremity Assessment Lower Extremity Assessment: Generalized weakness       Communication   Communication: HOH  Cognition Arousal/Alertness: Awake/alert Behavior During Therapy: Flat affect Overall Cognitive Status: History of cognitive impairments - at baseline                                        General Comments      Exercises     Assessment/Plan    PT Assessment All further PT needs can be met in the next venue of care  PT Problem List Decreased strength;Decreased activity tolerance;Decreased balance;Decreased mobility       PT Treatment Interventions      PT Goals (Current goals can be found in the Care  Plan section)       Frequency     Barriers to discharge Decreased caregiver support Lived alone    Co-evaluation               End of Session   Activity Tolerance: Patient tolerated treatment well Patient left: in bed;with call bell/phone within reach;with nursing/sitter in room Nurse Communication: Mobility status PT Visit Diagnosis: Unsteadiness on feet (R26.81);History of falling (Z91.81)    Time: 5001-6429 PT Time Calculation (min) (ACUTE ONLY): 8 min   Charges:   PT Evaluation $PT Eval Low Complexity: 1 Procedure     PT G Codes:   PT G-Codes **NOT FOR INPATIENT  CLASS** Functional Assessment Tool Used: AM-PAC 6 Clicks Basic Mobility Functional Limitation: Mobility: Walking and moving around Mobility: Walking and Moving Around Current Status (I3795): At least 40 percent but less than 60 percent impaired, limited or restricted Mobility: Walking and Moving Around Goal Status [phone]): At least 40 percent but less than 60 percent impaired, limited or restricted Mobility: Walking and Moving Around Discharge Status [phone]): At least 40 percent but less than 60 percent impaired, limited or restricted    [facility] PT [place] 09/08/2016, 1:26 PM

## 2016-09-08 NOTE — Progress Notes (Signed)
Pt has been accepted by [facility] for admission tomorrow afternoon, 3/23.  The facility requests that pt be observed in ED without sitter until that time.  CSW met with pt's daughter [name], [phone]) at bedside to present bed offer and describe the tx process.  W/E ED Coverage will f/u in am and facilitate the d/c to [facility].

## 2016-09-08 NOTE — ED Notes (Signed)
Pt's daughter at bedside for visitation

## 2016-09-08 NOTE — ED Provider Notes (Signed)
Patient is alert, cooperative.  She has reportedly been calm and noncombative for 2 days, by nursing.  IVC paperwork has been rescinded.  Patient is currently being seen by Education officer, museum, who has helped to make arrangements for placement, which will be done tomorrow afternoon, after she has been observed, without a sitter, until that time.  These findings, and plan, were discussed with the patient's daughter in the room, and all questions were answered.   Daleen Bo, MD 09/08/16 [phone]

## 2016-09-09 ENCOUNTER — Emergency Department (HOSPITAL_COMMUNITY)
Admission: EM | Admit: 2016-09-09 | Discharge: 2016-09-10 | Disposition: A | Discharge status: Home / Self Care | Payer: Medicare Other | Source: Home / Self Care | Attending: Emergency Medicine | Admitting: Emergency Medicine

## 2016-09-09 ENCOUNTER — Encounter (HOSPITAL_COMMUNITY): Payer: Self-pay | Admitting: Emergency Medicine

## 2016-09-09 DIAGNOSIS — I10 Essential (primary) hypertension: Secondary | ICD-10-CM | POA: Insufficient documentation

## 2016-09-09 DIAGNOSIS — R4689 Other symptoms and signs involving appearance and behavior: Secondary | ICD-10-CM

## 2016-09-09 DIAGNOSIS — F918 Other conduct disorders: Secondary | ICD-10-CM

## 2016-09-09 DIAGNOSIS — Z7982 Long term (current) use of aspirin: Secondary | ICD-10-CM | POA: Insufficient documentation

## 2016-09-09 DIAGNOSIS — Z79899 Other long term (current) drug therapy: Secondary | ICD-10-CM | POA: Insufficient documentation

## 2016-09-09 LAB — URINALYSIS, ROUTINE W REFLEX MICROSCOPIC
Bilirubin Urine: NEGATIVE
GLUCOSE, UA: NEGATIVE mg/dL
HGB URINE DIPSTICK: NEGATIVE
KETONES UR: NEGATIVE mg/dL
Leukocytes, UA: NEGATIVE
Nitrite: NEGATIVE
PROTEIN: NEGATIVE mg/dL
Specific Gravity, Urine: 1.013 (ref 1.005–1.030)
pH: 6 (ref 5.0–8.0)

## 2016-09-09 NOTE — ED Notes (Signed)
Bed: KM63 Expected date:  Expected time:  Means of arrival:  Comments: 83 yo F

## 2016-09-09 NOTE — ED Notes (Signed)
Pt's daughter leaving at this time - took pt's belongings in belongings bag and home meds w/her. Pt has upper dentures and staff continuing to look for pt's glasses.
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
## 2016-09-09 NOTE — ED Provider Notes (Signed)
PT has been accepted to nursing facility and discharged to facility in satisfactory condition.   Sharlett Iles, MD 09/09/16 [phone]

## 2016-09-09 NOTE — Clinical Social Work Note (Signed)
Clinical Social Worker facilitated patient discharge including contacting patient family and facility to confirm patient discharge plans.  Clinical information faxed to facility and family agreeable with plan.  CSW arranged ambulance transport via [facility] (4:30) to [place].  RN to call [phone] for report prior to discharge.  Clinical Social Worker will sign off for now as social work intervention is no longer needed. Please consult [place] again if new need arises.  [address] [place]

## 2016-09-09 NOTE — ED Notes (Signed)
Pt's daughters aware PTAR should be arriving soon depending upon calls. Voiced understanding.

## 2016-09-09 NOTE — Clinical Social Work Note (Signed)
CSW spoke with RN. Pt is doing well this AM without sitter. Facility ask that CSW call at lunch and if pt is still doing well CSW can set up transportation. RN with pt's daughter and will let her know.  [address] [place]

## 2016-09-09 NOTE — Clinical Social Work Placement (Signed)
   CLINICAL SOCIAL WORK PLACEMENT  NOTE  Date:  09/09/2016  Patient Details  Name: Kara Burnett MRN: [id number] Date of Birth: 1925-04-01  Clinical Social Work is seeking post-discharge placement for this patient at the [place] level of care (*CSW will initial, date and re-position this form in  chart as items are completed):      Patient/family provided with [place] Work Department's list of facilities offering this level of care within the geographic area requested by the patient (or if unable, by the patient's family).      Patient/family informed of their freedom to choose among providers that offer the needed level of care, that participate in Medicare, Medicaid or managed care program needed by the patient, have an available bed and are willing to accept the patient.      Patient/family informed of [facility]'s ownership interest in [facility] and [facility], as well as of the fact that they are under no obligation to receive care at these facilities.  PASRR submitted to EDS on       PASRR number received on 09/07/16     Existing PASRR number confirmed on       FL2 transmitted to all facilities in geographic area requested by pt/family on 09/07/16     FL2 transmitted to all facilities within larger geographic area on       Patient informed that his/her managed care company has contracts with or will negotiate with certain facilities, including the following:        Yes   Patient/family informed of bed offers received.  Patient chooses bed at [facility]     Physician recommends and patient chooses bed at      Patient to be transferred to [facility] on 09/09/16.  Patient to be transferred to facility by PTAR     Patient family notified on 09/09/16 of transfer.  Name of family member notified:  Helene Kelp     PHYSICIAN       Additional Comment:    _______________________________________________ Eileen Stanford,  LCSW 09/09/2016, 12:21 PM

## 2016-09-09 NOTE — ED Notes (Signed)
Pt eating lunch - pt advised she does not like noodles - tomato soup and grilled cheese sandwich ordered for pt as requested.

## 2016-09-09 NOTE — ED Notes (Signed)
Kara Burnett, [place], called and advised she will contact pt's daughter Kara Burnett - and set up time for paperwork needing to be performed. Advised her daughter has gone to cafeteria and will return. Requested she call her back at [phone] if she did not reach her. Pt's daughter aware and is speaking w/her now.

## 2016-09-09 NOTE — ED Notes (Addendum)
Pt has been very cooperative when she was given her medications if you will take a pill cutter and cut the pills into several little pieces and place them in pudding or apple sauce. She has been very pleasant and very thankful when doing so. She stated she was scared she was going to choke ever since she was a child when swallowing pills.

## 2016-09-09 NOTE — ED Notes (Signed)
Unable to locate pt's glasses - J Blue-Matthews, Agricultural consultant, aware and is sending paperwork to advise Risk Management - daughters aware. If glasses are found, please call pt's daughter [name] - [phone].

## 2016-09-09 NOTE — ED Notes (Signed)
Offered toileting multiple times to pt this am - pt declines.

## 2016-09-09 NOTE — ED Notes (Signed)
Per daughter, Helene Kelp, pt is missing her eyeglasses - advised she had them yesterday evening prior to her leaving. Advised pt hides things d/t paranoia - searched pt's bed linens - did not find them as of yet. Advised daughter will continue to look for them. Voiced appreciation. Pt noted w/upper dentures only. Daughter advised she lost the lower ones "a while ago" so pt has been on "soft diet".

## 2016-09-10 LAB — URINE CULTURE
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
## 2016-09-10 NOTE — ED Notes (Signed)
[facility] facility was called and was made aware of pt's condition and discharge.

## 2016-09-10 NOTE — ED Notes (Signed)
[facility] here to transport pt back to [place].

## 2016-09-10 NOTE — ED Notes (Signed)
[facility] was called for pt's transportation back to [facility] facility.

## 2016-09-11 ENCOUNTER — Other Ambulatory Visit: Payer: Self-pay

## 2016-09-11 NOTE — Patient Outreach (Signed)
[facility]) Care Management  09/11/2016  SAHANA BOYLAND [birth year] [id number]   Case Closure:   Patient admitted to SNF - [facility] for LTC 09/09/16.   [facility] notified Case closure letter sent to PCP and patient.   Nathaneil Canary, BSN, RN, [place] Management Care Management Coordinator [phone] Direct [phone] Cell [phone] Office [phone] Fax [email or web address]

## 2016-09-26 ENCOUNTER — Ambulatory Visit: Payer: Medicare Other

## 2016-09-30 ENCOUNTER — Other Ambulatory Visit: Payer: Self-pay | Admitting: Family Medicine

## 2016-10-18 ENCOUNTER — Ambulatory Visit: Payer: Self-pay | Admitting: Family Medicine

## 2016-10-20 ENCOUNTER — Encounter: Payer: Self-pay | Admitting: Family Medicine

## 2016-10-20 ENCOUNTER — Ambulatory Visit (INDEPENDENT_AMBULATORY_CARE_PROVIDER_SITE_OTHER): Payer: Medicare Other | Admitting: Family Medicine

## 2016-10-20 VITALS — BP 137/76 | HR 82 | Temp 98.2°F | Resp 19 | Ht 62.0 in | Wt 88.4 lb

## 2016-10-20 DIAGNOSIS — E43 Unspecified severe protein-calorie malnutrition: Secondary | ICD-10-CM | POA: Diagnosis not present

## 2016-10-20 DIAGNOSIS — D510 Vitamin B12 deficiency anemia due to intrinsic factor deficiency: Secondary | ICD-10-CM

## 2016-10-20 DIAGNOSIS — F22 Delusional disorders: Secondary | ICD-10-CM

## 2016-10-20 DIAGNOSIS — E785 Hyperlipidemia, unspecified: Secondary | ICD-10-CM

## 2016-10-20 DIAGNOSIS — I10 Essential (primary) hypertension: Secondary | ICD-10-CM

## 2016-10-20 LAB — BASIC METABOLIC PANEL
BUN: 16 mg/dL (ref 6–23)
CO2: 34 mEq/L — ABNORMAL HIGH (ref 19–32)
Calcium: 9.9 mg/dL (ref 8.4–10.5)
Chloride: 103 mEq/L (ref 96–112)
Creatinine, Ser: 0.85 mg/dL (ref 0.40–1.20)
GFR: 66.78 mL/min (ref >60.00)
Glucose, Bld: 94 mg/dL (ref 70–99)
POTASSIUM: 4.9 meq/L (ref 3.5–5.1)
Sodium: 142 mEq/L (ref 135–145)

## 2016-10-20 LAB — CBC WITH DIFFERENTIAL/PLATELET
BASOS ABS: 0.1 10*3/uL (ref 0.0–0.1)
BASOS PCT: 1.1 % (ref 0.0–3.0)
EOS PCT: 0.5 % (ref 0.0–5.0)
Eosinophils Absolute: 0 10*3/uL (ref 0.0–0.7)
HEMATOCRIT: 34.9 % — AB (ref 36.0–46.0)
HEMOGLOBIN: 11.5 g/dL — AB (ref 12.0–15.0)
LYMPHS PCT: 26.5 % (ref 12.0–46.0)
Lymphs Abs: 1.8 10*3/uL (ref 0.7–4.0)
MCHC: 32.9 g/dL (ref 30.0–36.0)
MCV: 98.9 fl (ref 78.0–100.0)
MONO ABS: 0.6 10*3/uL (ref 0.1–1.0)
Monocytes Relative: 8.3 % (ref 3.0–12.0)
NEUTROS PCT: 63.6 % (ref 43.0–77.0)
Neutro Abs: 4.3 10*3/uL (ref 1.4–7.7)
Platelets: 177 10*3/uL (ref 150.0–400.0)
RBC: 3.53 Mil/uL — AB (ref 3.87–5.11)
RDW: 15.1 % (ref 11.5–15.5)
WBC: 6.7 10*3/uL (ref 4.0–10.5)

## 2016-10-20 LAB — LIPID PANEL
Cholesterol: 168 mg/dL (ref 0–200)
HDL: 56.8 mg/dL (ref >39.00)
LDL Cholesterol: 90 mg/dL (ref 0–99)
NONHDL: 110.74
TRIGLYCERIDES: 103 mg/dL (ref 0.0–149.0)
Total CHOL/HDL Ratio: 3
VLDL: 20.6 mg/dL (ref 0.0–40.0)

## 2016-10-20 LAB — HEPATIC FUNCTION PANEL
ALT: 9 U/L (ref 0–35)
AST: 19 U/L (ref 0–37)
Albumin: 3.7 g/dL (ref 3.5–5.2)
Alkaline Phosphatase: 39 U/L (ref 39–117)
Bilirubin, Direct: 0.1 mg/dL (ref 0.0–0.3)
Total Bilirubin: 0.5 mg/dL (ref 0.2–1.2)
Total Protein: 5.9 g/dL — ABNORMAL LOW (ref 6.0–8.3)

## 2016-10-20 LAB — VITAMIN B12: Vitamin B-12: 297 pg/mL (ref 211–911)

## 2016-10-20 NOTE — Assessment & Plan Note (Signed)
Chronic problem.  Tolerating statin w/o difficulty.  At this time, it seems futile to continue statin given her condition but will discuss this w/ daughter prior to stopping.  Will follow.

## 2016-10-20 NOTE — Assessment & Plan Note (Signed)
Check B12 level and start injxns PRN

## 2016-10-20 NOTE — Assessment & Plan Note (Signed)
Chronic problem.  Well controlled today.  Asymptomatic.  Check labs.  No anticipated med changes. 

## 2016-10-20 NOTE — Assessment & Plan Note (Signed)
Much improved since starting Depakene and Seroquel.  No med changes at this time.

## 2016-10-20 NOTE — Progress Notes (Signed)
   Subjective:    Patient ID: Kara Burnett, female    DOB: [birth year], 83 y.o.   MRN: [id number]  HPI HTN- chronic problem, on Benazepril 10mg  daily w/ good BP control.  Denies CP, SOB, HAs, edema.  Hyperlipidemia- chronic problem, on Crestor 20mg  daily.  Pt is down to 88 lbs b/c she is not eating regularly.  No abd pain, N/V.  Dementia/paranoia- pt was committed to a nursing home after showing paranoia and aggressive behavior.  Recently moved to [place].  Currently on Aricept, Depakene, and Seroquel.    B12 deficiency- due for repeat labs to determine if shots are needed   Review of Systems For ROS see HPI     Objective:   Physical Exam  Constitutional: No distress.  Frail, cachetic  HENT:  Head: Normocephalic and atraumatic.  Eyes: Conjunctivae and EOM are normal. Pupils are equal, round, and reactive to light.  Neck: Normal range of motion. Neck supple. No thyromegaly present.  Cardiovascular: Normal rate, regular rhythm, normal heart sounds and intact distal pulses.   No murmur heard. Pulmonary/Chest: Effort normal and breath sounds normal. No respiratory distress.  Abdominal: Soft. She exhibits no distension. There is no tenderness.  Musculoskeletal: She exhibits no edema.  Lymphadenopathy:    She has no cervical adenopathy.  Neurological: She is alert.  Skin: Skin is warm and dry.  Psychiatric: She has a normal mood and affect. Her behavior is normal.  Vitals reviewed.         Assessment & Plan:

## 2016-10-20 NOTE — Patient Instructions (Signed)
Follow up in 6-8 weeks to recheck weight We'll notify you of your lab results and make any changes if needed Make sure you are eating regularly- please ask them to serve meals in the room (this is an order) If it is easier for you to see the doctor at [facility], I would completely understand! Call with any questions or concerns Happy Early Mother's Day!!!

## 2016-10-20 NOTE — Assessment & Plan Note (Signed)
Pt is down to 88 lbs.  Stressed need for regular meals.  Apparently new facility refuses to feed pt in room and makes her go to dining hall.  Wrote physician order for pt to eat in room.  Will continue to follow.

## 2016-10-20 NOTE — Progress Notes (Signed)
Pre visit review using our clinic review tool, if applicable. No additional management support is needed unless otherwise documented below in the visit note. 

## 2016-10-23 ENCOUNTER — Encounter: Payer: Self-pay | Admitting: General Practice

## 2016-11-07 ENCOUNTER — Telehealth: Payer: Self-pay | Admitting: Family Medicine

## 2016-11-07 DIAGNOSIS — R627 Adult failure to thrive: Secondary | ICD-10-CM

## 2016-11-07 NOTE — Telephone Encounter (Signed)
Spoke with patients daughter Kara Burnett. Daughter reports after patient had episode of being combative, her Seroquel was increased to 100mg  per day (from 50mg ). Kara Burnett states since increase, patient has been extremely sleepy and appears "drugged up". Daughter is concerned increase is too much considering patients weight. Also concerned patient has not received any type of physical therapy while at [facility] (has been resident < 1 month).

## 2016-11-07 NOTE — Telephone Encounter (Signed)
Please call to assess this further. Thank you.

## 2016-11-07 NOTE — Telephone Encounter (Signed)
LM requesting call back to discuss patients symptoms.

## 2016-11-07 NOTE — Telephone Encounter (Signed)
Daughter states that after visiting pt at [facility], pt seems to be out of it. Daughter seems to think that she is get way to much of seroquel and that she can barely keep her eyes open to eat her meals, daughter would like a call back regarding this when possible.

## 2016-11-08 MED ORDER — QUETIAPINE FUMARATE 50 MG PO TABS: 75.0000 mg | ORAL_TABLET | Freq: Every day | ORAL | 3 refills | Status: DC

## 2016-11-08 NOTE — Telephone Encounter (Signed)
Spoke with nurse caring for patient at [facility] who states they do have in-house physical therapy. Please fax order for PT to [phone]. Also advised of medication change, will update daughter.

## 2016-11-08 NOTE — Telephone Encounter (Signed)
Referral for PT placed. What do we need to send to them?

## 2016-11-08 NOTE — Addendum Note (Signed)
Addended by: Davis Gourd on: 11/08/2016 01:29 PM   Modules accepted: Orders

## 2016-11-08 NOTE — Telephone Encounter (Signed)
PT order faxed to [facility], [phone].

## 2016-11-08 NOTE — Telephone Encounter (Signed)
LM for daughter, Kara Burnett. Provided update regarding PT and medication.

## 2016-11-08 NOTE — Telephone Encounter (Signed)
Medication was filled. Can you find out if [facility] has PT

## 2016-11-08 NOTE — Telephone Encounter (Signed)
We will decrease the Seroquel to 75mg  daily (1.5 tabs of 50mg  daily) and see if sedation improves.  Also, I am happy to order PT but due to her previous combativeness, I'm not sure how willing or able she is to participate.

## 2016-11-08 NOTE — Addendum Note (Signed)
Addended by: Davis Gourd on: 11/08/2016 10:38 AM   Modules accepted: Orders

## 2016-11-08 NOTE — Telephone Encounter (Signed)
Referral placed.

## 2016-11-09 ENCOUNTER — Telehealth: Payer: Self-pay | Admitting: Family Medicine

## 2016-11-09 ENCOUNTER — Encounter: Payer: Self-pay | Admitting: General Practice

## 2016-11-09 NOTE — Telephone Encounter (Signed)
Caller with [facility] states that she never receive an order to reduce seroquel and asking [place] to resend it fax [phone].

## 2016-11-09 NOTE — Telephone Encounter (Signed)
Letter printed and faxed to [facility].

## 2016-11-12 ENCOUNTER — Inpatient Hospital Stay (HOSPITAL_COMMUNITY)
Admission: EM | Admit: 2016-11-12 | Discharge: 2016-11-16 | DRG: 377 | Payer: Medicare Other | Attending: Internal Medicine | Admitting: Internal Medicine

## 2016-11-12 DIAGNOSIS — Z681 Body mass index (BMI) 19 or less, adult: Secondary | ICD-10-CM

## 2016-11-12 DIAGNOSIS — K625 Hemorrhage of anus and rectum: Secondary | ICD-10-CM | POA: Diagnosis not present

## 2016-11-12 DIAGNOSIS — I4891 Unspecified atrial fibrillation: Secondary | ICD-10-CM | POA: Diagnosis present

## 2016-11-12 DIAGNOSIS — E785 Hyperlipidemia, unspecified: Secondary | ICD-10-CM | POA: Diagnosis present

## 2016-11-12 DIAGNOSIS — Z66 Do not resuscitate: Secondary | ICD-10-CM | POA: Diagnosis present

## 2016-11-12 DIAGNOSIS — R32 Unspecified urinary incontinence: Secondary | ICD-10-CM | POA: Diagnosis present

## 2016-11-12 DIAGNOSIS — I10 Essential (primary) hypertension: Secondary | ICD-10-CM | POA: Diagnosis present

## 2016-11-12 DIAGNOSIS — Z7189 Other specified counseling: Secondary | ICD-10-CM

## 2016-11-12 DIAGNOSIS — K219 Gastro-esophageal reflux disease without esophagitis: Secondary | ICD-10-CM | POA: Diagnosis present

## 2016-11-12 DIAGNOSIS — K921 Melena: Secondary | ICD-10-CM | POA: Diagnosis not present

## 2016-11-12 DIAGNOSIS — R001 Bradycardia, unspecified: Secondary | ICD-10-CM | POA: Diagnosis present

## 2016-11-12 DIAGNOSIS — N179 Acute kidney failure, unspecified: Secondary | ICD-10-CM | POA: Diagnosis present

## 2016-11-12 DIAGNOSIS — F0390 Unspecified dementia without behavioral disturbance: Secondary | ICD-10-CM | POA: Diagnosis present

## 2016-11-12 DIAGNOSIS — G9341 Metabolic encephalopathy: Secondary | ICD-10-CM | POA: Diagnosis not present

## 2016-11-12 DIAGNOSIS — E162 Hypoglycemia, unspecified: Secondary | ICD-10-CM | POA: Diagnosis present

## 2016-11-12 DIAGNOSIS — F0391 Unspecified dementia with behavioral disturbance: Secondary | ICD-10-CM | POA: Diagnosis present

## 2016-11-12 DIAGNOSIS — E43 Unspecified severe protein-calorie malnutrition: Secondary | ICD-10-CM | POA: Diagnosis present

## 2016-11-12 DIAGNOSIS — K5731 Diverticulosis of large intestine without perforation or abscess with bleeding: Principal | ICD-10-CM | POA: Diagnosis present

## 2016-11-12 DIAGNOSIS — D649 Anemia, unspecified: Secondary | ICD-10-CM | POA: Diagnosis present

## 2016-11-12 DIAGNOSIS — Z7401 Bed confinement status: Secondary | ICD-10-CM

## 2016-11-12 DIAGNOSIS — E876 Hypokalemia: Secondary | ICD-10-CM | POA: Clinically undetermined

## 2016-11-12 DIAGNOSIS — Z515 Encounter for palliative care: Secondary | ICD-10-CM

## 2016-11-12 DIAGNOSIS — D500 Iron deficiency anemia secondary to blood loss (chronic): Secondary | ICD-10-CM | POA: Diagnosis present

## 2016-11-12 DIAGNOSIS — R5383 Other fatigue: Secondary | ICD-10-CM

## 2016-11-12 DIAGNOSIS — Z79899 Other long term (current) drug therapy: Secondary | ICD-10-CM

## 2016-11-12 DIAGNOSIS — Z881 Allergy status to other antibiotic agents status: Secondary | ICD-10-CM

## 2016-11-12 HISTORY — DX: Unspecified osteoarthritis, unspecified site: M19.90

## 2016-11-12 HISTORY — DX: Unspecified dementia, unspecified severity, without behavioral disturbance, psychotic disturbance, mood disturbance, and anxiety: F03.90

## 2016-11-12 LAB — CBC WITH DIFFERENTIAL/PLATELET
BASOS ABS: 0 10*3/uL (ref 0.0–0.1)
Basophils Relative: 0 %
Eosinophils Absolute: 0 10*3/uL (ref 0.0–0.7)
Eosinophils Relative: 0 %
HCT: 36.3 % (ref 36.0–46.0)
Hemoglobin: 11.4 g/dL — ABNORMAL LOW (ref 12.0–15.0)
LYMPHS ABS: 1.7 10*3/uL (ref 0.7–4.0)
LYMPHS PCT: 12 %
MCH: 31.7 pg (ref 26.0–34.0)
MCHC: 31.4 g/dL (ref 30.0–36.0)
MCV: 100.8 fL — AB (ref 78.0–100.0)
Monocytes Absolute: 0.7 10*3/uL (ref 0.1–1.0)
Monocytes Relative: 5 %
Neutro Abs: 12 10*3/uL — ABNORMAL HIGH (ref 1.7–7.7)
Neutrophils Relative %: 83 %
Platelets: 168 10*3/uL (ref 150–400)
RBC: 3.6 MIL/uL — AB (ref 3.87–5.11)
RDW: 14.7 % (ref 11.5–15.5)
WBC: 14.5 10*3/uL — AB (ref 4.0–10.5)

## 2016-11-12 LAB — COMPREHENSIVE METABOLIC PANEL
ALT: 10 U/L — AB (ref 14–54)
AST: 21 U/L (ref 15–41)
Albumin: 2.8 g/dL — ABNORMAL LOW (ref 3.5–5.0)
Alkaline Phosphatase: 52 U/L (ref 38–126)
Anion gap: 8 (ref 5–15)
BILIRUBIN TOTAL: 0.5 mg/dL (ref 0.3–1.2)
BUN: 24 mg/dL — AB (ref 6–20)
CALCIUM: 9.5 mg/dL (ref 8.9–10.3)
CO2: 33 mmol/L — ABNORMAL HIGH (ref 22–32)
CREATININE: 1.01 mg/dL — AB (ref 0.44–1.00)
Chloride: 104 mmol/L (ref 101–111)
GFR calc Af Amer: 55 mL/min — ABNORMAL LOW (ref >60)
GFR, EST NON AFRICAN AMERICAN: 48 mL/min — AB (ref >60)
Glucose, Bld: 85 mg/dL (ref 65–99)
Potassium: 4 mmol/L (ref 3.5–5.1)
Sodium: 145 mmol/L (ref 135–145)
Total Protein: 5.7 g/dL — ABNORMAL LOW (ref 6.5–8.1)

## 2016-11-12 LAB — TYPE AND SCREEN
ABO/RH(D): O POS
ANTIBODY SCREEN: NEGATIVE

## 2016-11-12 LAB — CBC
HEMATOCRIT: 37 % (ref 36.0–46.0)
HEMOGLOBIN: 11.6 g/dL — AB (ref 12.0–15.0)
MCH: 31.6 pg (ref 26.0–34.0)
MCHC: 31.4 g/dL (ref 30.0–36.0)
MCV: 100.8 fL — ABNORMAL HIGH (ref 78.0–100.0)
Platelets: 170 10*3/uL (ref 150–400)
RBC: 3.67 MIL/uL — ABNORMAL LOW (ref 3.87–5.11)
RDW: 14.6 % (ref 11.5–15.5)
WBC: 15.1 10*3/uL — AB (ref 4.0–10.5)

## 2016-11-12 LAB — ABO/RH: ABO/RH(D): O POS

## 2016-11-12 LAB — HEMOGLOBIN AND HEMATOCRIT, BLOOD
HCT: 32.9 % — ABNORMAL LOW (ref 36.0–46.0)
Hemoglobin: 10.2 g/dL — ABNORMAL LOW (ref 12.0–15.0)

## 2016-11-12 LAB — POC OCCULT BLOOD, ED: FECAL OCCULT BLD: POSITIVE — AB

## 2016-11-12 MED ORDER — PANTOPRAZOLE SODIUM 40 MG IV SOLR
40.0000 mg | INTRAVENOUS | Status: DC
  Administered 2016-11-13: 40 mg via INTRAVENOUS
  Filled 2016-11-12: qty 40

## 2016-11-12 MED ORDER — ROSUVASTATIN CALCIUM 20 MG PO TABS
20.0000 mg | ORAL_TABLET | Freq: Every day | ORAL | Status: DC
  Administered 2016-11-13: 20 mg via ORAL
  Filled 2016-11-12 (×2): qty 1

## 2016-11-12 MED ORDER — ONDANSETRON HCL 4 MG/2ML IJ SOLN: 4.0000 mg | Freq: Four times a day (QID) | INTRAMUSCULAR | Status: DC | PRN

## 2016-11-12 MED ORDER — ONDANSETRON HCL 4 MG PO TABS: 4.0000 mg | ORAL_TABLET | Freq: Four times a day (QID) | ORAL | Status: DC | PRN

## 2016-11-12 MED ORDER — BOOST PLUS PO LIQD
237.0000 mL | Freq: Three times a day (TID) | ORAL | Status: DC
  Filled 2016-11-12: qty 237

## 2016-11-12 MED ORDER — QUETIAPINE FUMARATE 50 MG PO TABS
75.0000 mg | ORAL_TABLET | Freq: Every day | ORAL | Status: DC
  Administered 2016-11-12 – 2016-11-13 (×2): 75 mg via ORAL
  Filled 2016-11-12 (×2): qty 1

## 2016-11-12 MED ORDER — DONEPEZIL HCL 10 MG PO TABS
10.0000 mg | ORAL_TABLET | Freq: Every day | ORAL | Status: DC
  Administered 2016-11-12 – 2016-11-13 (×2): 10 mg via ORAL
  Filled 2016-11-12 (×3): qty 1

## 2016-11-12 MED ORDER — VALPROATE SODIUM 250 MG/5ML PO SOLN
250.0000 mg | Freq: Two times a day (BID) | ORAL | Status: DC
  Administered 2016-11-12 – 2016-11-13 (×3): 250 mg via ORAL
  Filled 2016-11-12 (×4): qty 5

## 2016-11-12 MED ORDER — METHYLPREDNISOLONE SODIUM SUCC 125 MG IJ SOLR
60.0000 mg | Freq: Once | INTRAMUSCULAR | Status: DC
Start: 2016-11-12 — End: 2016-11-12

## 2016-11-12 MED ORDER — LORAZEPAM 2 MG/ML IJ SOLN
0.2500 mg | Freq: Once | INTRAMUSCULAR | Status: AC
  Administered 2016-11-12: 0.25 mg via INTRAVENOUS
  Filled 2016-11-12: qty 1

## 2016-11-12 MED ORDER — ACETAMINOPHEN 650 MG RE SUPP: 650.0000 mg | Freq: Four times a day (QID) | RECTAL | Status: DC | PRN

## 2016-11-12 MED ORDER — LORAZEPAM 2 MG/ML IJ SOLN: 0.2500 mg | Freq: Once | INTRAMUSCULAR | Status: DC

## 2016-11-12 MED ORDER — ACETAMINOPHEN 325 MG PO TABS
650.0000 mg | ORAL_TABLET | Freq: Four times a day (QID) | ORAL | Status: DC | PRN
  Filled 2016-11-12: qty 2

## 2016-11-12 MED ORDER — BOOST / RESOURCE BREEZE PO LIQD
1.0000 | Freq: Three times a day (TID) | ORAL | Status: DC
  Administered 2016-11-12: 1 via ORAL

## 2016-11-12 MED ORDER — SODIUM CHLORIDE 0.9 % IV SOLN
INTRAVENOUS | Status: AC
  Administered 2016-11-12: 19:00:00 via INTRAVENOUS

## 2016-11-12 MED ORDER — PANTOPRAZOLE SODIUM 40 MG IV SOLR
40.0000 mg | Freq: Once | INTRAVENOUS | Status: AC
  Administered 2016-11-12: 40 mg via INTRAVENOUS
  Filled 2016-11-12: qty 40

## 2016-11-12 MED ORDER — VITAMIN D (ERGOCALCIFEROL) 1.25 MG (50000 UNIT) PO CAPS: 50000.0000 [IU] | ORAL_CAPSULE | ORAL | Status: DC

## 2016-11-12 MED ORDER — VITAMIN D 1000 UNITS PO TABS
2000.0000 [IU] | ORAL_TABLET | Freq: Every day | ORAL | Status: DC
  Administered 2016-11-13: 2000 [IU] via ORAL
  Filled 2016-11-12 (×2): qty 2

## 2016-11-12 MED ORDER — SODIUM CHLORIDE 0.9 % IV SOLN
Freq: Once | INTRAVENOUS | Status: AC
  Administered 2016-11-12: 15:00:00 via INTRAVENOUS

## 2016-11-12 NOTE — Progress Notes (Signed)
Dr Hal Hope informed unsuccessfully able to intiate telemetry monitoring d/t patient's continued agitation post once dose Ativan 0.25mg  IV. Patient removing telemetry monitor.

## 2016-11-12 NOTE — ED Provider Notes (Signed)
[facility] DEPT Provider Note   CSN: [id number] Arrival date & time: 11/12/16  1124     History   Chief Complaint Chief Complaint  Patient presents with  . Rectal Bleeding    HPI Kara Burnett is a 83 y.o. female.  The history is provided by the patient and medical records. No language interpreter was used.  Rectal Bleeding   Kara Burnett is a 83 y.o. female  with a past medical history as listed below including, but not limited to dementia, GERD, HTN, HLD who presents to the Emergency Department from [facility] for concerns of rectal bleeding noticed this morning by nursing staff. Daughter at bedside states this is patient's typical mental status which is alert, but not oriented. She has not been complaining of abdominal pain or any other complaints. No known fevers. Takes a baby aspirin daily, but no other anticoagulants. No history of GI bleed and has not ever been seen by gastroenterologist. No recent procedures  Level V caveat applies 2/2 dementia   Past Medical History:  Diagnosis Date  . Anemia   . GERD (gastroesophageal reflux disease)   . Herpes zoster   . Hyperlipidemia   . Hypertension   . Osteoporosis   . Retinal vein occlusion 2015   Dr.Sanders    Patient Active Problem List   Diagnosis Date Noted  . BRBPR (bright red blood per rectum) 11/12/2016  . Homicidal ideation   . Involuntary commitment   . Protein-calorie malnutrition, severe ([place]) 10/13/2015  . Edema of right foot 10/13/2015  . Dementia 06/15/2015  . Physical exam 04/14/2015  . Paranoia ([place]) 04/14/2015  . Acute bronchitis 04/18/2014  . Absence of bladder continence 04/18/2014  . Atypical nevus of neck 10/01/2013  . Acute upper respiratory infections of unspecified site 10/16/2012  . Bilateral conjunctivitis 10/16/2012  . Chest rales 10/16/2012  . Allergic rhinitis, cause unspecified 10/16/2012  . Hypercalcemia 04/10/2012  . Vitamin D deficiency 05/09/2010  .  ANEMIA, MILD 05/09/2010  . RESTLESS LEG SYNDROME 05/09/2010  . FATIGUE 11/16/2009  . POSTURAL LIGHTHEADEDNESS 02/01/2009  . Other specified disease of white blood cells 10/01/2008  . Depression 03/26/2008  . Pernicious anemia 07/05/2007  . CERVICAL RADICULOPATHY 07/05/2007  . Hyperlipidemia 04/06/2007  . Essential hypertension 04/06/2007  . GERD 04/06/2007  . Osteoporosis 04/06/2007    Past Surgical History:  Procedure Laterality Date  . Cataract surgery     Bilaterally  . CHOLECYSTECTOMY  1990  . COLONOSCOPY  2008   negative  . sacroplasty     post pelvic fracture    OB History    No data available       Home Medications    Prior to Admission medications   Medication Sig Start Date End Date Taking? Authorizing Provider  aspirin EC 81 MG tablet Take 81 mg by mouth daily.   Yes [provider]  benazepril (LOTENSIN) 10 MG tablet Take 1 tablet (10 mg total) by mouth daily. 11/17/15  Yes Midge Minium, MD  Cholecalciferol 2000 units CAPS Take 2,000 Units by mouth daily.   Yes [provider]  Cyanocobalamin (VITAMIN B-12 IJ) Inject as directed.   Yes [provider]  donepezil (ARICEPT) 10 MG tablet Take 1 tablet daily Patient taking differently: Take 10 mg by mouth at bedtime.  04/25/16  Yes Cameron Sprang, MD  Nutritional Supplements (BOOST PLUS PO) Take 237 mLs by mouth 3 (three) times daily.    Yes [provider]  QUEtiapine (SEROQUEL) 50 MG tablet Take 1.5 tablets (75 mg total) by mouth at bedtime. 11/08/16  Yes Midge Minium, MD  rosuvastatin (CRESTOR) 20 MG tablet TAKE 1 TABLET EVERY DAY 05/29/16  Yes Midge Minium, MD  traMADol (ULTRAM) 50 MG tablet TAKE 1 TABLET BY MOUTH AT BEDTIME AS NEEDED FOR PAIN 07/27/16  Yes Midge Minium, MD  Valproate Sodium (DEPAKENE) 250 MG/5ML SOLN solution Take 250 mg by mouth 2 (two) times daily.    Yes [provider]  Vitamin D, Ergocalciferol, (DRISDOL) 50000 units  CAPS capsule TAKE 1 CAPSULE (50,000 UNITS TOTAL) BY MOUTH EVERY 7 (SEVEN) DAYS. 07/10/16  Yes Midge Minium, MD    Family History Family History  Problem Relation Age of Onset  . Stroke Father   . Heart disease Father        MI in 72s  . Heart disease Sister        MI in 8s  . Diabetes Sister   . Stroke Mother        coma  . Hypothyroidism Son   . Heart disease Son     Social History Social History  Substance Use Topics  . Smoking status: Never Smoker  . Smokeless tobacco: Never Used  . Alcohol use No     Allergies   Ciprofloxacin   Review of Systems Review of Systems  Unable to perform ROS: Dementia  Gastrointestinal: Positive for hematochezia.     Physical Exam Updated Vital Signs BP (!) 130/45   Pulse (!) 51   Temp 97.7 F (36.5 C) (Rectal)   Resp 18   SpO2 91%   Physical Exam  Constitutional: She appears well-developed. No distress.  HENT:  Head: Normocephalic and atraumatic.  Cardiovascular: Normal rate, regular rhythm and normal heart sounds.   No murmur heard. Pulmonary/Chest: Effort normal and breath sounds normal. No respiratory distress.  Abdominal: Soft. She exhibits no distension. There is no tenderness.  Genitourinary:  Genitourinary Comments: No frank blood noted on rectal exam.  Neurological: She is alert.  Skin: Skin is warm and dry.  Nursing note and vitals reviewed.    ED Treatments / Results  Labs (all labs ordered are listed, but only abnormal results are displayed) Labs Reviewed  CBC WITH DIFFERENTIAL/PLATELET - Abnormal; Notable for the following:       Result Value   WBC 14.5 (*)    RBC 3.60 (*)    Hemoglobin 11.4 (*)    MCV 100.8 (*)    Neutro Abs 12.0 (*)    All other components within normal limits  COMPREHENSIVE METABOLIC PANEL - Abnormal; Notable for the following:    CO2 33 (*)    BUN 24 (*)    Creatinine, Ser 1.01 (*)    Total Protein 5.7 (*)    Albumin 2.8 (*)    ALT 10 (*)    GFR calc non Af Amer  48 (*)    GFR calc Af Amer 55 (*)    All other components within normal limits  POC OCCULT BLOOD, ED - Abnormal; Notable for the following:    Fecal Occult Bld POSITIVE (*)    All other components within normal limits  CBC  TYPE AND SCREEN    EKG  EKG Interpretation None       Radiology No results found.  Procedures Procedures (including critical care time)  Medications Ordered in ED Medications  QUEtiapine (SEROQUEL) tablet 75 mg (not administered)  lactose free nutrition (BOOST PLUS)  liquid 237 mL (not administered)  Valproate Sodium (DEPAKENE) solution 250 mg (not administered)  Cholecalciferol CAPS 2,000 Units (not administered)  Vitamin D (Ergocalciferol) (DRISDOL) capsule 50,000 Units (not administered)  rosuvastatin (CRESTOR) tablet 20 mg (not administered)  donepezil (ARICEPT) tablet 10 mg (not administered)  acetaminophen (TYLENOL) tablet 650 mg (not administered)    Or  acetaminophen (TYLENOL) suppository 650 mg (not administered)  ondansetron (ZOFRAN) tablet 4 mg (not administered)    Or  ondansetron (ZOFRAN) injection 4 mg (not administered)  pantoprazole (PROTONIX) injection 40 mg (not administered)  pantoprazole (PROTONIX) injection 40 mg (40 mg Intravenous Given 11/12/16 1435)  0.9 %  sodium chloride infusion ( Intravenous New Bag/Given 11/12/16 1439)     Initial Impression / Assessment and Plan / ED Course  I have reviewed the triage vital signs and the nursing notes.  Pertinent labs & imaging results that were available during my care of the patient were reviewed by me and considered in my medical decision making (see chart for details).    Kara Burnett is a 83 y.o. female who presents to ED for bright red blood in bowel movement x 2 this morning at nursing facility. Hemoccult +. hgb of 11.4 which appears to be around baseline. Protonix given. Fluids initiated. Hospitalist consulted who will admit for observation. GI, Dr. Havery Moros, consulted who  will see patient in the am. If symptoms worsen or complications arise, would like to be called.  Patient discussed with Dr. Wilson Singer who agrees with treatment plan.    Final Clinical Impressions(s) / ED Diagnoses   Final diagnoses:  BRBPR (bright red blood per rectum)    New Prescriptions New Prescriptions   No medications on file      Ward, Ozella Almond, PA-C 11/12/16 1539    Virgel Manifold, MD 11/13/16 [phone]

## 2016-11-12 NOTE — Progress Notes (Signed)
Received pt from ED, alert, forgetful, hard of hearing, uses hearing aid but not using it this time. Hearing aids was left at [facility] per pt's daughter [name].

## 2016-11-12 NOTE — ED Triage Notes (Signed)
Pt arrived via [facility] from [facility] after staff found the pt with rectal bleeding this morning. Pt is alert but not oriented, which is her baseline. Daughter is at bedside. No bleeding noted at triage. Hem card sample accomplished by EDP upon arrival to ED. Rectal temp 97.7 in ED. EMS v/s within normal limits.

## 2016-11-12 NOTE — ED Notes (Signed)
ED Provider at bedside. 

## 2016-11-12 NOTE — H&P (Signed)
History and Physical    Kara Burnett TIR:[id number] DOB: 04-16-1925 DOA: 11/12/2016  PCP: Midge Minium, MD Patient coming from: facility  Chief Complaint: BRBPR  HPI: Kara Burnett is a 83 y.o. female with medical history significant of dementia, hypertension, osteoporosis, hyperlipidemia presents to the emergency department from nursing facility chief complaint of bright red blood per rectum. [facility] asked to admit for evaluation of GI bleed.  Daughters at the bedside. She states that patient's current mental status is at baseline. She is mostly bedbound but does respond to physical therapy when able. She can make her basic wants and needs known. She is incontinent of bowel and bladder and does need assist with feedings. She recognizes family members but will not call her name. There is been no reports of any diarrhea nausea vomiting. No complaints of abdominal pain no abdominal distention. No reports of fever cough or change in mentation. Patient is not on any anticoagulants.  Chart review indicates patient had a colonoscopy 10 years ago did show some diverticulosis and internal hemorrhoids.  ED Course: In the emergency department she's afebrile hemodynamically stable and not hypoxic.  Review of Systems: As per HPI otherwise all other systems reviewed and are negative.   Ambulatory Status: Mostly bedbound will do wheelchair and walker with physical therapy assistance  Past Medical History:  Diagnosis Date  . Anemia   . GERD (gastroesophageal reflux disease)   . Herpes zoster   . Hyperlipidemia   . Hypertension   . Osteoporosis   . Retinal vein occlusion 2015   Dr.Sanders    Past Surgical History:  Procedure Laterality Date  . Cataract surgery     Bilaterally  . CHOLECYSTECTOMY  1990  . COLONOSCOPY  2008   negative  . sacroplasty     post pelvic fracture    Social History   Social History  . Marital status: Widowed    Spouse name: N/A  . Number of  children: 3  . Years of education: N/A   Occupational History  . Not on file.   Social History Main Topics  . Smoking status: Never Smoker  . Smokeless tobacco: Never Used  . Alcohol use No  . Drug use: No  . Sexual activity: Not on file   Other Topics Concern  . Not on file   Social History Narrative  . No narrative on file    Allergies  Allergen Reactions  . Ciprofloxacin Nausea And Vomiting    Family History  Problem Relation Age of Onset  . Stroke Father   . Heart disease Father        MI in 55s  . Heart disease Sister        MI in 43s  . Diabetes Sister   . Stroke Mother        coma  . Hypothyroidism Son   . Heart disease Son     Prior to Admission medications   Medication Sig Start Date End Date Taking? Authorizing Provider  aspirin EC 81 MG tablet Take 81 mg by mouth daily.   Yes [provider]  benazepril (LOTENSIN) 10 MG tablet Take 1 tablet (10 mg total) by mouth daily. 11/17/15  Yes Midge Minium, MD  Cholecalciferol 2000 units CAPS Take 2,000 Units by mouth daily.   Yes [provider]  Cyanocobalamin (VITAMIN B-12 IJ) Inject as directed.   Yes [provider]  donepezil (ARICEPT) 10 MG tablet Take 1 tablet daily Patient taking differently: Take  10 mg by mouth at bedtime.  04/25/16  Yes Cameron Sprang, MD  Nutritional Supplements (BOOST PLUS PO) Take 237 mLs by mouth 3 (three) times daily.    Yes [provider]  QUEtiapine (SEROQUEL) 50 MG tablet Take 1.5 tablets (75 mg total) by mouth at bedtime. 11/08/16  Yes Midge Minium, MD  rosuvastatin (CRESTOR) 20 MG tablet TAKE 1 TABLET EVERY DAY 05/29/16  Yes Midge Minium, MD  traMADol (ULTRAM) 50 MG tablet TAKE 1 TABLET BY MOUTH AT BEDTIME AS NEEDED FOR PAIN 07/27/16  Yes Midge Minium, MD  Valproate Sodium (DEPAKENE) 250 MG/5ML SOLN solution Take 250 mg by mouth 2 (two) times daily.    Yes [provider]  Vitamin D, Ergocalciferol,  (DRISDOL) 50000 units CAPS capsule TAKE 1 CAPSULE (50,000 UNITS TOTAL) BY MOUTH EVERY 7 (SEVEN) DAYS. 07/10/16  Yes Midge Minium, MD    Physical Exam: Vitals:   11/12/16 1430 11/12/16 1445 11/12/16 1500 11/12/16 1530  BP: (!) 103/39 (!) 132/46 (!) 130/45 (!) 140/41  Pulse:      Resp: (!) 21 18 18 16   Temp:      TempSrc:      SpO2:         General:  Appears calm and comfortable . Thin, Frail pale Eyes:  PERRL, EOMI, normal lids, iris ENT:  grossly normal hearing, lips & tongue, mucous membranes of her mouth are pale and dry Neck:  no LAD, masses or thyromegaly Cardiovascular:  RRR, no m/r/g. No LE edema.  Respiratory:  Normal effort respirations somewhat shallow breath sounds distant no crackles no wheeze Abdomen:  soft, ntnd, positive bowel sounds no guarding or rebounding Skin:  no rash or induration seen on limited exam Musculoskeletal:  grossly normal tone BUE/BLE, good ROM, no bony abnormality Psychiatric:  grossly normal mood and affect, speech fluent and appropriate, AOx3 Neurologic:  Alert oriented to self only responds to simple questions attempts to follow commands  Labs on Admission: I have personally reviewed following labs and imaging studies  CBC:  Recent Labs Lab 11/12/16 1339  WBC 14.5*  NEUTROABS 12.0*  HGB 11.4*  HCT 36.3  MCV 100.8*  PLT 322   Basic Metabolic Panel:  Recent Labs Lab 11/12/16 1339  NA 145  K 4.0  CL 104  CO2 33*  GLUCOSE 85  BUN 24*  CREATININE 1.01*  CALCIUM 9.5   GFR: CrCl cannot be calculated (Unknown ideal weight.). Liver Function Tests:  Recent Labs Lab 11/12/16 1339  AST 21  ALT 10*  ALKPHOS 52  BILITOT 0.5  PROT 5.7*  ALBUMIN 2.8*   No results for input(s): LIPASE, AMYLASE in the last 168 hours. No results for input(s): AMMONIA in the last 168 hours. Coagulation Profile: No results for input(s): INR, PROTIME in the last 168 hours. Cardiac Enzymes: No results for input(s): CKTOTAL, CKMB, CKMBINDEX,  TROPONINI in the last 168 hours. BNP (last 3 results) No results for input(s): PROBNP in the last 8760 hours. HbA1C: No results for input(s): HGBA1C in the last 72 hours. CBG: No results for input(s): GLUCAP in the last 168 hours. Lipid Profile: No results for input(s): CHOL, HDL, LDLCALC, TRIG, CHOLHDL, LDLDIRECT in the last 72 hours. Thyroid Function Tests: No results for input(s): TSH, T4TOTAL, FREET4, T3FREE, THYROIDAB in the last 72 hours. Anemia Panel: No results for input(s): VITAMINB12, FOLATE, FERRITIN, TIBC, IRON, RETICCTPCT in the last 72 hours. Urine analysis:    Component Value Date/Time   COLORURINE YELLOW  09/09/2016 [place] 09/09/2016 1145   LABSPEC 1.013 09/09/2016 1145   PHURINE 6.0 09/09/2016 1145   GLUCOSEU NEGATIVE 09/09/2016 1145   HGBUR NEGATIVE 09/09/2016 1145   HGBUR trace-lysed 10/01/2008 1455   BILIRUBINUR NEGATIVE 09/09/2016 1145   BILIRUBINUR negative 05/17/2016 1531   KETONESUR NEGATIVE 09/09/2016 1145   PROTEINUR NEGATIVE 09/09/2016 1145   UROBILINOGEN 1.0 05/17/2016 1531   UROBILINOGEN 1 04/17/2014 1629   NITRITE NEGATIVE 09/09/2016 1145   LEUKOCYTESUR NEGATIVE 09/09/2016 1145    Creatinine Clearance: CrCl cannot be calculated (Unknown ideal weight.).  Sepsis Labs: @LABRCNTIP (procalcitonin:4,lacticidven:4) )No results found for this or any previous visit (from the past 240 hour(s)).   Radiological Exams on Admission: No results found.  EKG: pending  Assessment/Plan Principal Problem:   BRBPR (bright red blood per rectum) Active Problems:   ANEMIA, MILD   Essential hypertension   Absence of bladder continence   Dementia   Protein-calorie malnutrition, severe ([facility])   Bradycardia   #1. Bright red blood per rectum. Patient is not on anticoagulation. Chart review does indicate a colonoscopy noted diverticulosis 10 years ago. She had one episode at the facility where blood was noted in her depends. Hemoglobin 11.4.  Stool heme positive. He was provided with Protonix in the emergency department -Admit -Clear liquids -Serial CBCs -Monitor bowel movements -Await GI recommendations tomorrow -If symptoms worsen or complications arise will call GI tonight  #2. Anemia. History of same. Hemoglobin 11.4. No further bleeding since presentation -Monitor  #3. Hypertension. Blood pressure stable in the emergency department. Home medications include benazepril -Hold antihypertensives for now -Gentle IV fluids -Monitor  #4. Bradycardia. Heart rate range 48-61. -Monitor on telemetry -Obtain an EKG -Obtain TSH -Cycle troponin  #5. Dementia.. Stable at baseline according to the daughter who is at the bedside    DVT prophylaxis: scd  Code Status: dnr  Family Communication: daughter at bedside  Disposition Plan: back to facility  Consults called: gi  Admission status: obs    Radene Gunning MD [facility]  If 7PM-7AM, please contact night-coverage [email or web address] Password [email or web address]  11/12/2016, 4:10 PM

## 2016-11-12 NOTE — ED Notes (Signed)
Blood transfusion permission given by MPOA, per daughter.

## 2016-11-12 NOTE — Progress Notes (Signed)
Message to on call MD via Amion informing of patient's increased agitation; kicking staff, throwing items from bedside table, yelling "help" and accusing staff of trying to kill her and her daughter.

## 2016-11-13 DIAGNOSIS — K625 Hemorrhage of anus and rectum: Secondary | ICD-10-CM | POA: Diagnosis not present

## 2016-11-13 DIAGNOSIS — F0390 Unspecified dementia without behavioral disturbance: Secondary | ICD-10-CM | POA: Diagnosis not present

## 2016-11-13 DIAGNOSIS — I10 Essential (primary) hypertension: Secondary | ICD-10-CM | POA: Diagnosis not present

## 2016-11-13 LAB — CBC
HEMATOCRIT: 33.9 % — AB (ref 36.0–46.0)
Hemoglobin: 10.5 g/dL — ABNORMAL LOW (ref 12.0–15.0)
MCH: 31.2 pg (ref 26.0–34.0)
MCHC: 31 g/dL (ref 30.0–36.0)
MCV: 100.6 fL — AB (ref 78.0–100.0)
PLATELETS: 154 10*3/uL (ref 150–400)
RBC: 3.37 MIL/uL — ABNORMAL LOW (ref 3.87–5.11)
RDW: 14.6 % (ref 11.5–15.5)
WBC: 9.5 10*3/uL (ref 4.0–10.5)

## 2016-11-13 LAB — GLUCOSE, CAPILLARY
GLUCOSE-CAPILLARY: 90 mg/dL (ref 65–99)
GLUCOSE-CAPILLARY: 73 mg/dL (ref 65–99)
GLUCOSE-CAPILLARY: 68 mg/dL (ref 65–99)
Glucose-Capillary: 74 mg/dL (ref 65–99)
Glucose-Capillary: 69 mg/dL (ref 65–99)
Glucose-Capillary: 136 mg/dL — ABNORMAL HIGH (ref 65–99)
Glucose-Capillary: 124 mg/dL — ABNORMAL HIGH (ref 65–99)
Glucose-Capillary: 119 mg/dL — ABNORMAL HIGH (ref 65–99)
Glucose-Capillary: 88 mg/dL (ref 65–99)

## 2016-11-13 LAB — HEMOGLOBIN AND HEMATOCRIT, BLOOD
HCT: 33.6 % — ABNORMAL LOW (ref 36.0–46.0)
HCT: 33.1 % — ABNORMAL LOW (ref 36.0–46.0)
HEMOGLOBIN: 10.4 g/dL — AB (ref 12.0–15.0)
Hemoglobin: 10.7 g/dL — ABNORMAL LOW (ref 12.0–15.0)

## 2016-11-13 LAB — MRSA PCR SCREENING: MRSA by PCR: NEGATIVE

## 2016-11-13 LAB — TROPONIN I: Troponin I: <0.03 ng/mL (ref <0.03)

## 2016-11-13 LAB — BASIC METABOLIC PANEL
Anion gap: 7 (ref 5–15)
BUN: 24 mg/dL — AB (ref 6–20)
CHLORIDE: 108 mmol/L (ref 101–111)
CO2: 27 mmol/L (ref 22–32)
CREATININE: 0.98 mg/dL (ref 0.44–1.00)
Calcium: 8.4 mg/dL — ABNORMAL LOW (ref 8.9–10.3)
GFR calc non Af Amer: 49 mL/min — ABNORMAL LOW (ref >60)
GFR, EST AFRICAN AMERICAN: 57 mL/min — AB (ref >60)
Glucose, Bld: 109 mg/dL — ABNORMAL HIGH (ref 65–99)
Potassium: 3.5 mmol/L (ref 3.5–5.1)
Sodium: 142 mmol/L (ref 135–145)

## 2016-11-13 LAB — LACTIC ACID, PLASMA: Lactic Acid, Venous: 1.9 mmol/L (ref 0.5–1.9)

## 2016-11-13 MED ORDER — DEXTROSE-NACL 5-0.45 % IV SOLN
INTRAVENOUS | Status: DC
  Administered 2016-11-13: 13:00:00 via INTRAVENOUS

## 2016-11-13 MED ORDER — SODIUM CHLORIDE 0.9 % IV SOLN: INTRAVENOUS | Status: DC

## 2016-11-13 MED ORDER — ENSURE ENLIVE PO LIQD: 237.0000 mL | Freq: Three times a day (TID) | ORAL | Status: DC

## 2016-11-13 MED ORDER — SODIUM CHLORIDE 0.9 % IV BOLUS (SEPSIS)
1000.0000 mL | Freq: Once | INTRAVENOUS | Status: AC
  Administered 2016-11-13: 1000 mL via INTRAVENOUS

## 2016-11-13 MED ORDER — BOOST / RESOURCE BREEZE PO LIQD
1.0000 | Freq: Three times a day (TID) | ORAL | Status: DC
  Administered 2016-11-13: 1 via ORAL

## 2016-11-13 MED ORDER — DEXTROSE 50 % IV SOLN
INTRAVENOUS | Status: AC
  Administered 2016-11-13 – 2016-11-14 (×2): 25 mL
  Filled 2016-11-13: qty 50

## 2016-11-13 NOTE — Progress Notes (Signed)
On call MD notified via Amion message of patient's decreased B/p and elevated HR.Response pending.

## 2016-11-13 NOTE — Progress Notes (Signed)
Hypoglycemic Event  CBG: 69   Treatment: 15 GM carbohydrate snack  Symptoms: None  Follow-up CBG: Time:1245 CBG Result:68  Possible Reasons for Event: Inadequate meal intake  Comments/MD notified:md notified, giving 1/2 amp of d50 now and adding dextrose to maintenance fluids. Will recheck blood sugar in 15 mins    Zigmund Daniel, Helyn Numbers

## 2016-11-13 NOTE — Progress Notes (Signed)
PROGRESS NOTE                                                                                                                                                                                                             Patient Demographics:    Kara Burnett, is a 83 y.o. female, DOB - December 18, 1924, EPP:[id number]  Admit date - 11/12/2016   Admitting Physician Elwin Mocha, MD  Outpatient Primary MD for the patient is Midge Minium, MD  LOS - 0   Chief Complaint  Patient presents with  . Rectal Bleeding       Brief Narrative   83 y.o. female nursing home patient with history of dementia with behavioral issues, hypertension, hyperlipidemia, GERD, and status post cholecystectomy, came from SNF with bright red blood per rectum, with known history of diverticulosis.   Subjective:    Kara Burnett today Poor historian, but today she denies any abdominal pain, shortness of breath, chest pain, nausea or vomiting. Patient last bowel movement yesterday evening, with small amount of blood in stool.   Assessment  & Plan :    Principal Problem:   BRBPR (bright red blood per rectum) Active Problems:   ANEMIA, MILD   Essential hypertension   Absence of bladder continence   Dementia   Protein-calorie malnutrition, severe (HCC)   Bradycardia   BRBPR - This is most likely related to lower GI. From diverticulosis, most recent colonoscopy in 2008, overall hemoglobin remained stable, today is 10.5, 11.4 on admission, she is not an anticoagulation, GI input greatly appreciated, plan for conservative management with serial H&H and transfusion if indicated, continue with PPI, advanced to full liquid diet.  Hypertension - Blood pressure remains acceptable, so continue to hold antihypertensive medication  Hyperlipidemia - Continue with statin  Dementia - Continue with Seroquel, Depakene and Aricept  Code Status : DNR  Family Communication  :  daughter at bedside  Disposition Plan  : back to SNF when stable  Consults  :  GI  Procedures  : None  DVT Prophylaxis  :   SCDs   Lab Results  Component Value Date   PLT 154 11/13/2016   Antibiotics  :    Anti-infectives    None        Objective:   Vitals:   11/12/16 1955 11/13/16 0100 11/13/16  0305 11/13/16 0825  BP: (!) 151/54 (!) 92/52 132/67 (!) 131/59  Pulse: (!) 50 (!) 59 63 60  Resp: 16 15 17 14   Temp: 98.8 F (37.1 C)  97.7 F (36.5 C) 97.4 F (36.3 C)  TempSrc: Oral  Oral Axillary  SpO2: 97% 97% 98% 98%  Weight:   42.6 kg (93 lb 14.7 oz)   Height:   5\' 2"  (1.575 m)     Wt Readings from Last 3 Encounters:  11/13/16 42.6 kg (93 lb 14.7 oz)  10/20/16 40.1 kg (88 lb 6 oz)  05/24/16 42.2 kg (93 lb)     Intake/Output Summary (Last 24 hours) at 11/13/16 1105 Last data filed at 11/13/16 0900  Gross per 24 hour  Intake             1225 ml  Output                0 ml  Net             1225 ml     Physical Exam  Frail, chronically ill appearing female in no apparent distress, confused, with significant cognition impairment (at baseline per daughter secondary to advanced dementia ) Supple Neck,No JVD Symmetrical Chest wall movement, Good air movement bilaterally, CTAB RRR,No Gallops,Rubs or new Murmurs +ve B.Sounds, Abd Soft, No tenderness,  No rebound - guarding or rigidity. No Cyanosis, Clubbing or edema, No new Rash or bruise     Data Review:    CBC  Recent Labs Lab 11/12/16 1339 11/12/16 1610 11/12/16 2030 11/13/16 0224  WBC 14.5* 15.1*  --  9.5  HGB 11.4* 11.6* 10.2* 10.5*  HCT 36.3 37.0 32.9* 33.9*  PLT 168 170  --  154  MCV 100.8* 100.8*  --  100.6*  MCH 31.7 31.6  --  31.2  MCHC 31.4 31.4  --  31.0  RDW 14.7 14.6  --  14.6  LYMPHSABS 1.7  --   --   --   MONOABS 0.7  --   --   --   EOSABS 0.0  --   --   --   BASOSABS 0.0  --   --   --     Chemistries   Recent Labs Lab 11/12/16 1339 11/13/16 0224  NA 145 142  K 4.0 3.5   CL 104 108  CO2 33* 27  GLUCOSE 85 109*  BUN 24* 24*  CREATININE 1.01* 0.98  CALCIUM 9.5 8.4*  AST 21  --   ALT 10*  --   ALKPHOS 52  --   BILITOT 0.5  --    ------------------------------------------------------------------------------------------------------------------ No results for input(s): CHOL, HDL, LDLCALC, TRIG, CHOLHDL, LDLDIRECT in the last 72 hours.  Lab Results  Component Value Date   HGBA1C 6.1 (H) 06/08/2006   ------------------------------------------------------------------------------------------------------------------ No results for input(s): TSH, T4TOTAL, T3FREE, THYROIDAB in the last 72 hours.  Invalid input(s): FREET3 ------------------------------------------------------------------------------------------------------------------ No results for input(s): VITAMINB12, FOLATE, FERRITIN, TIBC, IRON, RETICCTPCT in the last 72 hours.  Coagulation profile No results for input(s): INR, PROTIME in the last 168 hours.  No results for input(s): DDIMER in the last 72 hours.  Cardiac Enzymes  Recent Labs Lab 11/13/16 0224  TROPONINI <0.03   ------------------------------------------------------------------------------------------------------------------ No results found for: BNP  Inpatient Medications  Scheduled Meds: . cholecalciferol  2,000 Units Oral Daily  . donepezil  10 mg Oral QHS  . feeding supplement  1 Container Oral TID BM  . LORazepam  0.25 mg Intravenous Once  .  pantoprazole (PROTONIX) IV  40 mg Intravenous Q24H  . QUEtiapine  75 mg Oral QHS  . rosuvastatin  20 mg Oral Daily  . Valproate Sodium  250 mg Oral BID  . [START ON 11/14/2016] Vitamin D (Ergocalciferol)  50,000 Units Oral Q7 days   Continuous Infusions: . sodium chloride 50 mL/hr at 11/13/16 0900   PRN Meds:.acetaminophen **OR** acetaminophen, ondansetron **OR** ondansetron (ZOFRAN) IV  Micro Results Recent Results (from the past 240 hour(s))  MRSA PCR Screening      Status: None   Collection Time: 11/13/16  3:05 AM  Result Value Ref Range Status   MRSA by PCR NEGATIVE NEGATIVE Final    Comment:        The GeneXpert MRSA Assay (FDA approved for NASAL specimens only), is one component of a comprehensive MRSA colonization surveillance program. It is not intended to diagnose MRSA infection nor to guide or monitor treatment for MRSA infections.     Radiology Reports No results found.  [facility], Kara Burnett M.D on 11/13/2016 at 11:05 AM  Between 7am to 7pm - Pager - [phone]  After 7pm go to [email or web address] - password [facility]  [facility] -  Office  [phone]

## 2016-11-13 NOTE — Progress Notes (Signed)
Initial Nutrition Assessment  DOCUMENTATION CODES:   Severe malnutrition in context of chronic illness, Underweight  INTERVENTION:   Boost Breeze po TID, each supplement provides 250 kcal and 9 grams of protein --And/Or-- Ensure Enlive po TID, each supplement provides 350 kcal and 20 grams of protein  NUTRITION DIAGNOSIS:   Malnutrition (severe) related to chronic illness (dementia) as evidenced by severe depletion of muscle mass, severe depletion of body fat.  GOAL:   Patient will meet greater than or equal to 90% of their needs  MONITOR:   PO intake, Supplement acceptance, Skin, I & O's, Labs  REASON FOR ASSESSMENT:   Malnutrition Screening Tool    ASSESSMENT:   83 y.o. female nursing home patient with history of dementia with behavioral issues, hypertension, hyperlipidemia, GERD, and status post cholecystectomy, came from SNF with bright red blood per rectum, with known history of diverticulosis.  Patient unable to provide any nutrition hx. RN was feeding her applesauce with medications during RD visit. Patient is not taking PO's well at this time. Nutrition-Focused physical exam completed. Findings are severe fat depletion, severe muscle depletion, and no edema. Patient with severe PCM.  Labs reviewed. Medications reviewed and include Vitamin D.  Diet Order:  Diet full liquid Room service appropriate? Yes; Fluid consistency: Thin  Skin:  Reviewed, no issues  Last BM:  5/28  Height:   Ht Readings from Last 1 Encounters:  11/13/16 5\' 2"  (1.575 m)    Weight:   Wt Readings from Last 1 Encounters:  11/13/16 93 lb 14.7 oz (42.6 kg)    Ideal Body Weight:  50 kg  BMI:  Body mass index is 17.18 kg/m.  Estimated Nutritional Needs:   Kcal:  1300-1500  Protein:  70-80 gm  Fluid:  1.5 L  EDUCATION NEEDS:   No education needs identified at this time  Molli Barrows, [place], [place], [place] Pager [phone] After Hours Pager [phone]

## 2016-11-13 NOTE — Progress Notes (Signed)
Follow up blood sugar= 136

## 2016-11-13 NOTE — Progress Notes (Signed)
On call MD informed via Amion of patient's elevated HR, response pending. Patient resting, respiratios even and unlabored.

## 2016-11-13 NOTE — Progress Notes (Signed)
Patient transferred via bed to 4E15; family, Ladona Ridgel (daughter), informed

## 2016-11-13 NOTE — Consult Note (Signed)
Consultation  Referring Provider: [facility] MD Primary Care Physician:  Midge Minium, MD Primary Gastroenterologist:  Dr.Stark  Reason for Consultation:  Rectal bleeding  HPI: Kara Burnett is a 83 y.o. female nursing home patient with history of dementia with behavioral issues, hypertension, hyperlipidemia, GERD, and status post cholecystectomy. Patient was brought to the emergency room yesterday afternoon after she had a bowel movement with what was called a significant amount of bright red blood mixed with the stool. The patient has not had any further bowel movements overnight. He is unable to give any meaningful history regarding any abdominal discomfort. Apparently she's been in her usual state of health though not eating well recently. She has not had any previous GI bleeding. She is on a baby aspirin, no other blood thinners. Colonoscopy was done in 2008 per Dr. Fuller Plan with finding of left colon diverticulosis and small internal hemorrhoids. EGD at that same time showed chronic gastritis and duodenitis. Review of labs shows her baseline hemoglobin to be about 11.5. On admission yesterday hemoglobin 11.4 and today hemoglobin 10.5, MCV of 100, platelets 154, BUN 24 and creatinine 0.9 She has been hemodynamically stable since arrival. I spoke to her daughter at bedside who said they have not been aware of any complaints of rectal bleeding until this episode yesterday.   Past Medical History:  Diagnosis Date  . Anemia   . GERD (gastroesophageal reflux disease)   . Herpes zoster   . Hyperlipidemia   . Hypertension   . Osteoporosis   . Retinal vein occlusion 2015   Dr.Sanders    Past Surgical History:  Procedure Laterality Date  . Cataract surgery     Bilaterally  . CHOLECYSTECTOMY  1990  . COLONOSCOPY  2008   negative  . sacroplasty     post pelvic fracture    Prior to Admission medications   Medication Sig Start Date End Date Taking? Authorizing  Provider  aspirin EC 81 MG tablet Take 81 mg by mouth daily.   Yes [provider]  benazepril (LOTENSIN) 10 MG tablet Take 1 tablet (10 mg total) by mouth daily. 11/17/15  Yes Midge Minium, MD  Cholecalciferol 2000 units CAPS Take 2,000 Units by mouth daily.   Yes [provider]  Cyanocobalamin (VITAMIN B-12 IJ) Inject as directed.   Yes [provider]  donepezil (ARICEPT) 10 MG tablet Take 1 tablet daily Patient taking differently: Take 10 mg by mouth at bedtime.  04/25/16  Yes Cameron Sprang, MD  Nutritional Supplements (BOOST PLUS PO) Take 237 mLs by mouth 3 (three) times daily.    Yes [provider]  QUEtiapine (SEROQUEL) 50 MG tablet Take 1.5 tablets (75 mg total) by mouth at bedtime. 11/08/16  Yes Midge Minium, MD  rosuvastatin (CRESTOR) 20 MG tablet TAKE 1 TABLET EVERY DAY 05/29/16  Yes Midge Minium, MD  traMADol (ULTRAM) 50 MG tablet TAKE 1 TABLET BY MOUTH AT BEDTIME AS NEEDED FOR PAIN 07/27/16  Yes Midge Minium, MD  Valproate Sodium (DEPAKENE) 250 MG/5ML SOLN solution Take 250 mg by mouth 2 (two) times daily.    Yes [provider]  Vitamin D, Ergocalciferol, (DRISDOL) 50000 units CAPS capsule TAKE 1 CAPSULE (50,000 UNITS TOTAL) BY MOUTH EVERY 7 (SEVEN) DAYS. 07/10/16  Yes Midge Minium, MD    Current Facility-Administered Medications  Medication Dose Route Frequency Provider Last Rate Last Dose  . 0.9 %  sodium chloride infusion   Intravenous Continuous  Reubin Milan, MD 50 mL/hr at 11/13/16 0900    . acetaminophen (TYLENOL) tablet 650 mg  650 mg Oral Q6H PRN Radene Gunning, NP       Or  . acetaminophen (TYLENOL) suppository 650 mg  650 mg Rectal Q6H PRN Radene Gunning, NP      . cholecalciferol (VITAMIN D) tablet 2,000 Units  2,000 Units Oral Daily Black, Karen M, NP      . donepezil (ARICEPT) tablet 10 mg  10 mg Oral QHS Radene Gunning, NP   10 mg at 11/12/16 2114  . feeding supplement (BOOST /  RESOURCE BREEZE) liquid 1 Container  1 Container Oral TID BM Elwin Mocha, MD   1 Container at 11/12/16 2116  . LORazepam (ATIVAN) injection 0.25 mg  0.25 mg Intravenous Once Rise Patience, MD      . ondansetron [facility]) tablet 4 mg  4 mg Oral Q6H PRN Radene Gunning, NP       Or  . ondansetron [facility]) injection 4 mg  4 mg Intravenous Q6H PRN Black, Karen M, NP      . pantoprazole (PROTONIX) injection 40 mg  40 mg Intravenous Q24H Black, Karen M, NP      . QUEtiapine (SEROQUEL) tablet 75 mg  75 mg Oral QHS Radene Gunning, NP   75 mg at 11/12/16 2114  . rosuvastatin (CRESTOR) tablet 20 mg  20 mg Oral Daily Black, Lezlie Octave, NP      . Valproate Sodium (DEPAKENE) solution 250 mg  250 mg Oral BID Radene Gunning, NP   250 mg at 11/12/16 2114  . [START ON 11/14/2016] Vitamin D (Ergocalciferol) (DRISDOL) capsule 50,000 Units  50,000 Units Oral Q7 days Radene Gunning, NP        Allergies as of 11/12/2016 - Review Complete 11/12/2016  Allergen Reaction Noted  . Ciprofloxacin Nausea And Vomiting     Family History  Problem Relation Age of Onset  . Stroke Father   . Heart disease Father        MI in 64s  . Heart disease Sister        MI in 83s  . Diabetes Sister   . Stroke Mother        coma  . Hypothyroidism Son   . Heart disease Son     Social History   Social History  . Marital status: Widowed    Spouse name: N/A  . Number of children: 3  . Years of education: N/A   Occupational History  . Not on file.   Social History Main Topics  . Smoking status: Never Smoker  . Smokeless tobacco: Never Used  . Alcohol use No  . Drug use: No  . Sexual activity: Not on file   Other Topics Concern  . Not on file   Social History Narrative  . No narrative on file    Review of Systems: Pertinent positive and negative review of systems were noted in the above HPI section.  All other review of systems was otherwise negative. Physical Exam: Vital signs in last 24 hours: Temp:   [97.4 F (36.3 C)-98.8 F (37.1 C)] 97.4 F (36.3 C) (05/28 0825) Pulse Rate:  [50-64] 60 (05/28 0825) Resp:  [13-25] 14 (05/28 0825) BP: (92-151)/(39-80) 131/59 (05/28 0825) SpO2:  [90 %-98 %] 98 % (05/28 0825) Weight:  [93 lb 14.7 oz (42.6 kg)] 93 lb 14.7 oz (42.6 kg) (05/28 0305) Last BM Date: 11/13/16  General:   Alert,  Well-developed,Very elderly thin frail appearing white female sleeping with her mouth open and irritable when disturbed, Head:  Normocephalic and atraumatic. Eyes:  Sclera clear, no icterus.   Conjunctiva pink. Ears:  Normal auditory acuity. Nose:  No deformity, discharge,  or lesions. Mouth:  No deformity or lesions.   Neck:  Supple; no masses or thyromegaly. Lungs:  Clear throughout to auscultation.   No wheezes, crackles, or rhonchi. Heart:  Regular rate and rhythm; no murmurs, clicks, rubs,  or gallops. Abdomen:  Soft,nontender, BS active,nonpalp mass or hsm.   Rectal:  Deferred -no gross blood on rectal exam per ER physician on admit Msk:  Symmetrical without gross deformities. . Pulses:  Normal pulses noted. Extremities:  Without clubbing or edema. Neurologic:  Alert and  oriented x4;  grossly normal neurologically. Skin:  Intact without significant lesions or rashes.. Psych:  Alert and cooperative. Normal mood and affect.  Intake/Output from previous day: 05/27 0701 - 05/28 0700 In: 1025 [I.V.:25; IV Piggyback:1000] Out: 0  Intake/Output this shift: Total I/O In: 200 [I.V.:200] Out: -   Lab Results:  Recent Labs  11/12/16 1339 11/12/16 1610 11/12/16 2030 11/13/16 0224  WBC 14.5* 15.1*  --  9.5  HGB 11.4* 11.6* 10.2* 10.5*  HCT 36.3 37.0 32.9* 33.9*  PLT 168 170  --  154   BMET  Recent Labs  11/12/16 1339 11/13/16 0224  NA 145 142  K 4.0 3.5  CL 104 108  CO2 33* 27  GLUCOSE 85 109*  BUN 24* 24*  CREATININE 1.01* 0.98  CALCIUM 9.5 8.4*   LFT  Recent Labs  11/12/16 1339  PROT 5.7*  ALBUMIN 2.8*  AST 21  ALT 10*  ALKPHOS  52  BILITOT 0.5   PT/INR No results for input(s): LABPROT, INR in the last 72 hours. Hepatitis Panel No results for input(s): HEPBSAG, HCVAB, HEPAIGM, HEPBIGM in the last 72 hours.        IMPRESSION:  #63 83 year old white female with dementia, nursing home patient primarily bedbound admitted with bright red blood in her stool. Onset yesterday 1. Patient has not had evidence of any significant GI bleeding since admission. Her hemoglobin may be down about a gram from her baseline. She has previously documented diverticulosis and internal hemorrhoids. Suspect bleeding may have been diverticular. #2 acute on chronic anemia, macrocytic #3 hypertension hx #4 hyperlipidemia #5 malnutrition/hypoalbuminemia  Plan; discussed with the patient's daughter at bedside. #1 start full liquid diet #2 serial hemoglobins and transfuse for hemoglobin 8 or less #3 will plan supportive management and do not anticipate endoscopic evaluation unless patient would have persistent and prolonged evidence of GI bleeding. Patient's daughter is comfortable with this approach.   Mekiah Cambridge  11/13/2016, 9:41 AM

## 2016-11-13 NOTE — Progress Notes (Signed)
Status change update given to Ladona Ridgel (daughter). Will follow up upon transfer.

## 2016-11-14 ENCOUNTER — Observation Stay (HOSPITAL_COMMUNITY): Payer: Medicare Other

## 2016-11-14 ENCOUNTER — Encounter (HOSPITAL_COMMUNITY): Payer: Self-pay | Admitting: *Deleted

## 2016-11-14 DIAGNOSIS — D649 Anemia, unspecified: Secondary | ICD-10-CM

## 2016-11-14 DIAGNOSIS — Z515 Encounter for palliative care: Secondary | ICD-10-CM | POA: Diagnosis present

## 2016-11-14 DIAGNOSIS — Z7189 Other specified counseling: Secondary | ICD-10-CM | POA: Diagnosis not present

## 2016-11-14 DIAGNOSIS — F0390 Unspecified dementia without behavioral disturbance: Secondary | ICD-10-CM | POA: Diagnosis not present

## 2016-11-14 DIAGNOSIS — E43 Unspecified severe protein-calorie malnutrition: Secondary | ICD-10-CM | POA: Diagnosis present

## 2016-11-14 DIAGNOSIS — I10 Essential (primary) hypertension: Secondary | ICD-10-CM | POA: Diagnosis present

## 2016-11-14 DIAGNOSIS — R5383 Other fatigue: Secondary | ICD-10-CM | POA: Diagnosis not present

## 2016-11-14 DIAGNOSIS — K921 Melena: Secondary | ICD-10-CM | POA: Diagnosis present

## 2016-11-14 DIAGNOSIS — Z881 Allergy status to other antibiotic agents status: Secondary | ICD-10-CM | POA: Diagnosis not present

## 2016-11-14 DIAGNOSIS — K625 Hemorrhage of anus and rectum: Secondary | ICD-10-CM | POA: Diagnosis not present

## 2016-11-14 DIAGNOSIS — Z66 Do not resuscitate: Secondary | ICD-10-CM | POA: Diagnosis present

## 2016-11-14 DIAGNOSIS — N179 Acute kidney failure, unspecified: Secondary | ICD-10-CM | POA: Diagnosis present

## 2016-11-14 DIAGNOSIS — Z681 Body mass index (BMI) 19 or less, adult: Secondary | ICD-10-CM | POA: Diagnosis not present

## 2016-11-14 DIAGNOSIS — I4891 Unspecified atrial fibrillation: Secondary | ICD-10-CM | POA: Diagnosis present

## 2016-11-14 DIAGNOSIS — G9341 Metabolic encephalopathy: Secondary | ICD-10-CM | POA: Diagnosis present

## 2016-11-14 DIAGNOSIS — E162 Hypoglycemia, unspecified: Secondary | ICD-10-CM | POA: Diagnosis present

## 2016-11-14 DIAGNOSIS — F0281 Dementia in other diseases classified elsewhere with behavioral disturbance: Secondary | ICD-10-CM | POA: Diagnosis not present

## 2016-11-14 DIAGNOSIS — Z7401 Bed confinement status: Secondary | ICD-10-CM | POA: Diagnosis not present

## 2016-11-14 DIAGNOSIS — E876 Hypokalemia: Secondary | ICD-10-CM | POA: Diagnosis present

## 2016-11-14 DIAGNOSIS — R001 Bradycardia, unspecified: Secondary | ICD-10-CM | POA: Diagnosis present

## 2016-11-14 DIAGNOSIS — K5730 Diverticulosis of large intestine without perforation or abscess without bleeding: Secondary | ICD-10-CM | POA: Diagnosis present

## 2016-11-14 DIAGNOSIS — Z79899 Other long term (current) drug therapy: Secondary | ICD-10-CM | POA: Diagnosis not present

## 2016-11-14 DIAGNOSIS — E785 Hyperlipidemia, unspecified: Secondary | ICD-10-CM | POA: Diagnosis present

## 2016-11-14 DIAGNOSIS — K219 Gastro-esophageal reflux disease without esophagitis: Secondary | ICD-10-CM | POA: Diagnosis present

## 2016-11-14 DIAGNOSIS — F0391 Unspecified dementia with behavioral disturbance: Secondary | ICD-10-CM | POA: Diagnosis present

## 2016-11-14 DIAGNOSIS — D500 Iron deficiency anemia secondary to blood loss (chronic): Secondary | ICD-10-CM | POA: Diagnosis present

## 2016-11-14 LAB — GLUCOSE, CAPILLARY
GLUCOSE-CAPILLARY: 107 mg/dL — AB (ref 65–99)
GLUCOSE-CAPILLARY: 72 mg/dL (ref 65–99)
GLUCOSE-CAPILLARY: 80 mg/dL (ref 65–99)
GLUCOSE-CAPILLARY: 85 mg/dL (ref 65–99)
Glucose-Capillary: 109 mg/dL — ABNORMAL HIGH (ref 65–99)
Glucose-Capillary: 56 mg/dL — ABNORMAL LOW (ref 65–99)
Glucose-Capillary: 65 mg/dL (ref 65–99)
Glucose-Capillary: 74 mg/dL (ref 65–99)
Glucose-Capillary: 93 mg/dL (ref 65–99)
Glucose-Capillary: 95 mg/dL (ref 65–99)

## 2016-11-14 LAB — BASIC METABOLIC PANEL
Anion gap: 5 (ref 5–15)
BUN: 22 mg/dL — AB (ref 6–20)
CALCIUM: 8.3 mg/dL — AB (ref 8.9–10.3)
CHLORIDE: 109 mmol/L (ref 101–111)
CO2: 29 mmol/L (ref 22–32)
CREATININE: 0.87 mg/dL (ref 0.44–1.00)
GFR calc Af Amer: >60 mL/min (ref >60)
GFR calc non Af Amer: 57 mL/min — ABNORMAL LOW (ref >60)
Glucose, Bld: 132 mg/dL — ABNORMAL HIGH (ref 65–99)
Potassium: 3.5 mmol/L (ref 3.5–5.1)
Sodium: 143 mmol/L (ref 135–145)

## 2016-11-14 LAB — HEMOGLOBIN AND HEMATOCRIT, BLOOD
HCT: 30.8 % — ABNORMAL LOW (ref 36.0–46.0)
Hemoglobin: 9.8 g/dL — ABNORMAL LOW (ref 12.0–15.0)

## 2016-11-14 MED ORDER — FAMOTIDINE 20 MG PO TABS: 20.0000 mg | ORAL_TABLET | Freq: Every day | ORAL | Status: DC

## 2016-11-14 MED ORDER — DEXTROSE-NACL 5-0.45 % IV SOLN
INTRAVENOUS | Status: DC
  Administered 2016-11-14: 75 mL/h via INTRAVENOUS

## 2016-11-14 MED ORDER — VALPROATE SODIUM 250 MG/5ML PO SOLN: 125.0000 mg | Freq: Two times a day (BID) | ORAL | Status: DC

## 2016-11-14 MED ORDER — DEXTROSE-NACL 5-0.45 % IV SOLN
INTRAVENOUS | Status: DC
Start: 2016-11-14 — End: 2016-11-14

## 2016-11-14 MED ORDER — QUETIAPINE FUMARATE 25 MG PO TABS: 25.0000 mg | ORAL_TABLET | Freq: Every day | ORAL | Status: DC

## 2016-11-14 MED ORDER — SODIUM CHLORIDE 0.9% FLUSH: 3.0000 mL | INTRAVENOUS | Status: DC | PRN

## 2016-11-14 MED ORDER — SODIUM CHLORIDE 0.9% FLUSH
3.0000 mL | Freq: Two times a day (BID) | INTRAVENOUS | Status: DC
  Administered 2016-11-16: 3 mL via INTRAVENOUS

## 2016-11-14 MED ORDER — LORAZEPAM 2 MG/ML IJ SOLN
0.5000 mg | Freq: Once | INTRAMUSCULAR | Status: AC
  Administered 2016-11-15: 0.5 mg via INTRAVENOUS
  Filled 2016-11-14: qty 1

## 2016-11-14 NOTE — Progress Notes (Addendum)
PROGRESS NOTE                                                                                                                                                                                                             Patient Demographics:    Kara Burnett, is a 83 y.o. female, DOB - 05-11-1925, INO:[id number]  Admit date - 11/12/2016   Admitting Physician Elwin Mocha, MD  Outpatient Primary MD for the patient is Midge Minium, MD  LOS - 0   Chief Complaint  Patient presents with  . Rectal Bleeding       Brief Narrative   83 y.o. female nursing home patient with history of dementia with behavioral issues, hypertension, hyperlipidemia, GERD, and status post cholecystectomy, came from SNF with bright red blood per rectum, with known history of diverticulosis.   Subjective:    Cincere Deprey today Poor historian, Hopefully to arouse, cannot provide any complaints, per staff she had one bowel movement overnight with no blood .   Assessment  & Plan :    Principal Problem:   BRBPR (bright red blood per rectum) Active Problems:   ANEMIA, MILD   Essential hypertension   Absence of bladder continence   Dementia   Protein-calorie malnutrition, severe (HCC)   Bradycardia   BRBPR - This is most likely related to lower GI. From diverticulosis, most recent colonoscopy in 2008, overall hemoglobin remained stable, she is not an anticoagulation, GI input greatly appreciated, plan for conservative. - No indication for procedure given stable hemoglobin, and GI bleed resolved.  Hypertension - Blood pressure remains acceptable, so continue to hold antihypertensive medication  Hyperlipidemia - Continue with statin  Dementia - Appears to be advanced, patient remains altered today, will discontinue Seroquel, will decrease Depakene(as can be stopped abruptly), continue with Aricept.  Atrial fibrillation - Patient developed A. fib during  hospital stay, usually with RVR, currently rate controlled, she is not a candidate for an evaluation given her advanced age, she is significantly frail, and her GI bleed, at this point there is no indication for any further workup.  Encephalopathy - Patient is lethargic today, hard to arouse, this is most likely related to significant baseline dementia, CT head with no acute finding, but significant for atrophy and ventriculomegaly, discussed goals of care with the daughter, they do not wish  for any aggressive measures, they wish for her to go back to the facility with hospice which has been arranged, but unfortunately she remains lethargic and no by mouth intake, and developed hypoglycemia, for which she will require D5 half-normal saline infusion, discussed plan with daughter, will stop Seroquel, will decrease Depakene, will hold his offending agents to see if her mental status improved, subsequently her oral intake can be improved to avoid hypoglycemia, then she can go to facility with hospice, but if she remains lethargic with no oral intake, then palliative care will be involved which is cold of care and possible hospice facility placement.  Hypoglycemia - CBG 56 this afternoon ,This is secondary to decreased by mouth intake as  she is lethargic, will start on D5 half-normal saline, and reassess in a.m..  Code Status : DNR  Family Communication  : daughter at bedside  Disposition Plan  : Pending clinical progression over the next 24 hours.  Consults  :  GI  Procedures  : None  DVT Prophylaxis  :   SCDs   Lab Results  Component Value Date   PLT 154 11/13/2016   Antibiotics  :    Anti-infectives    None        Objective:   Vitals:   11/14/16 0500 11/14/16 0840 11/14/16 1242 11/14/16 1635  BP:  (!) 144/52 (!) 149/54   Pulse:  (!) 59 64   Resp:  18 16   Temp:  97.4 F (36.3 C) 97.5 F (36.4 C) 97.7 F (36.5 C)  TempSrc:  Oral Oral Axillary  SpO2:  99% 97%   Weight: 44.9  kg (98 lb 15.8 oz)     Height:        Wt Readings from Last 3 Encounters:  11/14/16 44.9 kg (98 lb 15.8 oz)  10/20/16 40.1 kg (88 lb 6 oz)  05/24/16 42.2 kg (93 lb)     Intake/Output Summary (Last 24 hours) at 11/14/16 1637 Last data filed at 11/14/16 0800  Gross per 24 hour  Intake              550 ml  Output                0 ml  Net              550 ml     Physical Exam  Frail, chronically ill-appearing female, lethargic in bed,  Fair entry bilaterally, no wheezing or rales rhonchi   irregular irregular,No Gallops,Rubs or new Murmurs Abdomen soft, nontender, bowel sounds present Extremities with no edema, clubbing or cyanosis    Data Review:    CBC  Recent Labs Lab 11/12/16 1339 11/12/16 1610 11/12/16 2030 11/13/16 0224 11/13/16 1042 11/13/16 1837 11/14/16 0224  WBC 14.5* 15.1*  --  9.5  --   --   --   HGB 11.4* 11.6* 10.2* 10.5* 10.7* 10.4* 9.8*  HCT 36.3 37.0 32.9* 33.9* 33.6* 33.1* 30.8*  PLT 168 170  --  154  --   --   --   MCV 100.8* 100.8*  --  100.6*  --   --   --   MCH 31.7 31.6  --  31.2  --   --   --   MCHC 31.4 31.4  --  31.0  --   --   --   RDW 14.7 14.6  --  14.6  --   --   --   LYMPHSABS 1.7  --   --   --   --   --   --  MONOABS 0.7  --   --   --   --   --   --   EOSABS 0.0  --   --   --   --   --   --   BASOSABS 0.0  --   --   --   --   --   --     Chemistries   Recent Labs Lab 11/12/16 1339 11/13/16 0224 11/14/16 0224  NA 145 142 143  K 4.0 3.5 3.5  CL 104 108 109  CO2 33* 27 29  GLUCOSE 85 109* 132*  BUN 24* 24* 22*  CREATININE 1.01* 0.98 0.87  CALCIUM 9.5 8.4* 8.3*  AST 21  --   --   ALT 10*  --   --   ALKPHOS 52  --   --   BILITOT 0.5  --   --    ------------------------------------------------------------------------------------------------------------------ No results for input(s): CHOL, HDL, LDLCALC, TRIG, CHOLHDL, LDLDIRECT in the last 72 hours.  Lab Results  Component Value Date   HGBA1C 6.1 (H) 06/08/2006    ------------------------------------------------------------------------------------------------------------------ No results for input(s): TSH, T4TOTAL, T3FREE, THYROIDAB in the last 72 hours.  Invalid input(s): FREET3 ------------------------------------------------------------------------------------------------------------------ No results for input(s): VITAMINB12, FOLATE, FERRITIN, TIBC, IRON, RETICCTPCT in the last 72 hours.  Coagulation profile No results for input(s): INR, PROTIME in the last 168 hours.  No results for input(s): DDIMER in the last 72 hours.  Cardiac Enzymes  Recent Labs Lab 11/13/16 0224  TROPONINI <0.03   ------------------------------------------------------------------------------------------------------------------ No results found for: BNP  Inpatient Medications  Scheduled Meds: . cholecalciferol  2,000 Units Oral Daily  . donepezil  10 mg Oral QHS  . famotidine  20 mg Oral Daily  . feeding supplement  1 Container Oral TID BM   Or  . feeding supplement (ENSURE ENLIVE)  237 mL Oral TID BM  . LORazepam  0.25 mg Intravenous Once  . rosuvastatin  20 mg Oral Daily  . sodium chloride flush  3 mL Intravenous Q12H  . Valproate Sodium  125 mg Oral BID  . Vitamin D (Ergocalciferol)  50,000 Units Oral Q7 days   Continuous Infusions: . dextrose 5 % and 0.45% NaCl 75 mL/hr (11/14/16 1605)   PRN Meds:.acetaminophen **OR** acetaminophen, ondansetron **OR** ondansetron (ZOFRAN) IV, sodium chloride flush  Micro Results Recent Results (from the past 240 hour(s))  MRSA PCR Screening     Status: None   Collection Time: 11/13/16  3:05 AM  Result Value Ref Range Status   MRSA by PCR NEGATIVE NEGATIVE Final    Comment:        The GeneXpert MRSA Assay (FDA approved for NASAL specimens only), is one component of a comprehensive MRSA colonization surveillance program. It is not intended to diagnose MRSA infection nor to guide or monitor treatment  for MRSA infections.     Radiology Reports Ct Head Wo Contrast  Result Date: 11/14/2016 CLINICAL DATA:  83 year old female with lethargy. History of dementia. Initial encounter. EXAM: CT HEAD WITHOUT CONTRAST TECHNIQUE: Contiguous axial images were obtained from the base of the skull through the vertex without intravenous contrast. COMPARISON:  09/13/2016. FINDINGS: Brain: No intracranial hemorrhage or CT evidence of large acute infarct. Moderate chronic microvascular changes. Moderate global atrophy without hydrocephalus. No intracranial mass lesion noted on this unenhanced exam. Vascular: Vascular calcifications Skull: No acute abnormality Sinuses/Orbits: No acute orbital abnormality. Post lens replacement. Visualized paranasal sinuses are clear. Other: Mastoid air cells and middle ear cavities are clear. IMPRESSION: No  acute intracranial abnormality. Chronic microvascular changes. Atrophy. Electronically Signed   By: Genia Del M.D.   On: 11/14/2016 13:57    Kentley Blyden M.D on 11/14/2016 at 4:37 PM  Between 7am to 7pm - Pager - [phone]  After 7pm go to [email or web address] - password [facility]  [facility] -  Office  [phone]

## 2016-11-14 NOTE — Clinical Social Work Note (Signed)
Clinical Social Work Assessment  Patient Details  Name: Kara Burnett MRN: [id number] Date of Birth: [birth year]  Date of referral:  11/14/16               Reason for consult:  Discharge Planning, End of Life/Hospice                Permission sought to share information with:  Facility Sport and exercise psychologist, Family Supports Permission granted to share information::  Yes, Verbal Permission Granted  Name::     Ladona Ridgel  Agency::  [facility]  Relationship::  Daughter  Contact Information:  [phone]  Housing/Transportation Living arrangements for the past 2 months:  [place] (Memory Care) Source of Information:  Medical Team, Adult Children, Facility Patient Interpreter Needed:  None Criminal Activity/Legal Involvement Pertinent to Current Situation/Hospitalization:  No - Comment as needed Significant Relationships:  Adult Children Lives with:  Facility Resident Do you feel safe going back to the place where you live?  Yes Need for family participation in patient care:  Yes (Comment)  Care giving concerns:  Patient is a resident at [facility] in their memory care unit.   Social Worker assessment / plan:  Patient not fully oriented. CSW met with patient's daughter, Kara Burnett at bedside while patient down for test. CSW introduced role and explained that discharge planning would be discussed. Patient's daughter confirmed that patient is from [facility] and the plan is for her to return with hospice. ALF is able to accommodate that. Per MD, patient's blood sugar 50 today so plan for discharge tomorrow. No further concerns. CSW encouraged patient's daughter to contact CSW as needed. CSW will continue to follow patient and her family for support and facilitate discharge to ALF once medically stable.  Employment status:  Retired Nurse, adult PT Recommendations:  Not assessed at this time Information /  Referral to community resources:  Other (Comment Required) (Plan is to return to ALF)  Patient/Family's Response to care:  Patient not fully oriented. Patient's daughter agreeable to return to ALF with hospice. Patient's daughters supportive and involved in patient's care. Patient's daughter appreciated social work intervention.  Patient/Family's Understanding of and Emotional Response to Diagnosis, Current Treatment, and Prognosis:  Patient not fully oriented. Patient's daughters have a good understanding of the reason for admission and the patient's need for hospice services. Patient's daughter appears happy with hospital care.  Emotional Assessment Appearance:  Appears stated age Attitude/Demeanor/Rapport:  Unable to Assess Affect (typically observed):  Unable to Assess Orientation:  Oriented to Self Alcohol / Substance use:  Never Used Psych involvement (Current and /or in the community):  No (Comment)  Discharge Needs  Concerns to be addressed:  Care Coordination Readmission within the last 30 days:  No Current discharge risk:  Cognitively Impaired, Terminally ill Barriers to Discharge:  Continued Medical Work up   Candie Chroman, LCSW 11/14/2016, 6:05 PM

## 2016-11-14 NOTE — NC FL2 (Signed)
[place] LEVEL OF CARE SCREENING TOOL     IDENTIFICATION  Patient Name: Kara Burnett Birthdate: 04-Jun-1925 Sex: female Admission Date (Current Location): 11/12/2016  [facility] and [place] Number:  Herbalist and Address:  [facility], [place] [address], [place], [place] [address]      Provider Number: [id number]  Attending Physician Name and Address:  Elgergawy, Silver Huguenin, MD  Relative Name and Phone Number:       Current Level of Care: Hospital Recommended Level of Care: [place] (with hospice) Memory Care Unit Prior Approval Number:    Date Approved/Denied:   PASRR Number:    Discharge Plan: Other (Comment) (ALF with hospice) Memory Care Unit    Current Diagnoses: Patient Active Problem List   Diagnosis Date Noted  . BRBPR (bright red blood per rectum) 11/12/2016  . Bradycardia 11/12/2016  . Homicidal ideation   . Involuntary commitment   . Protein-calorie malnutrition, severe ([place]) 10/13/2015  . Edema of right foot 10/13/2015  . Dementia 06/15/2015  . Physical exam 04/14/2015  . Paranoia ([place]) 04/14/2015  . Acute bronchitis 04/18/2014  . Absence of bladder continence 04/18/2014  . Atypical nevus of neck 10/01/2013  . Acute upper respiratory infections of unspecified site 10/16/2012  . Bilateral conjunctivitis 10/16/2012  . Chest rales 10/16/2012  . Allergic rhinitis, cause unspecified 10/16/2012  . Hypercalcemia 04/10/2012  . Vitamin D deficiency 05/09/2010  . ANEMIA, MILD 05/09/2010  . RESTLESS LEG SYNDROME 05/09/2010  . FATIGUE 11/16/2009  . POSTURAL LIGHTHEADEDNESS 02/01/2009  . Other specified disease of white blood cells 10/01/2008  . Depression 03/26/2008  . Pernicious anemia 07/05/2007  . CERVICAL RADICULOPATHY 07/05/2007  . Hyperlipidemia 04/06/2007  . Essential hypertension 04/06/2007  . GERD 04/06/2007  . Osteoporosis 04/06/2007    Orientation RESPIRATION BLADDER Height & Weight      Self  Normal Incontinent Weight: 98 lb 15.8 oz (44.9 kg) Height:  5\' 2"  (157.5 cm)  BEHAVIORAL SYMPTOMS/MOOD NEUROLOGICAL BOWEL NUTRITION STATUS   (Impulsive, poor judgement, poor safety awareness)  (Dementia) Continent Diet (DYS 2)  AMBULATORY STATUS COMMUNICATION OF NEEDS Skin     Verbally Bruising                       Personal Care Assistance Level of Assistance              Functional Limitations Info  Sight, Hearing, Speech Sight Info: Adequate Hearing Info: Adequate Speech Info: Adequate    SPECIAL CARE FACTORS FREQUENCY  Blood pressure                    Contractures Contractures Info: Not present    Additional Factors Info  Code Status, Allergies, Psychotropic Code Status Info: DNR Allergies Info: Ciprofloxacin Psychotropic Info: Depression: Depakene 125 mg PO BID         Current Medications (11/14/2016):  This is the current hospital active medication list Current Facility-Administered Medications  Medication Dose Route Frequency Provider Last Rate Last Dose  . acetaminophen (TYLENOL) tablet 650 mg  650 mg Oral Q6H PRN Radene Gunning, NP       Or  . acetaminophen (TYLENOL) suppository 650 mg  650 mg Rectal Q6H PRN Renard Hamper Lezlie Octave, NP      . cholecalciferol (VITAMIN D) tablet 2,000 Units  2,000 Units Oral Daily Radene Gunning, NP   2,000 Units at 11/13/16 1221  . donepezil (ARICEPT) tablet 10  mg  10 mg Oral QHS Radene Gunning, NP   10 mg at 11/13/16 2158  . famotidine (PEPCID) tablet 20 mg  20 mg Oral Daily Gribbin, Jaylah Goodlow J, PA-C      . feeding supplement (BOOST / RESOURCE BREEZE) liquid 1 Container  1 Container Oral TID BM Alvira Philips, [place]   1 Container at 11/13/16 2159   Or  . feeding supplement (ENSURE ENLIVE) (ENSURE ENLIVE) liquid 237 mL  237 mL Oral TID BM Bladen, Donalynn Furlong, [place]      . LORazepam (ATIVAN) injection 0.25 mg  0.25 mg Intravenous Once Rise Patience, MD      . ondansetron [facility]) tablet 4 mg  4 mg Oral Q6H  PRN Radene Gunning, NP       Or  . ondansetron [facility]) injection 4 mg  4 mg Intravenous Q6H PRN Black, Karen M, NP      . rosuvastatin (CRESTOR) tablet 20 mg  20 mg Oral Daily Radene Gunning, NP   20 mg at 11/13/16 1222  . sodium chloride flush (NS) 0.9 % injection 3 mL  3 mL Intravenous Q12H Elgergawy, Dawood S, MD      . sodium chloride flush (NS) 0.9 % injection 3 mL  3 mL Intravenous PRN Elgergawy, Silver Huguenin, MD      . Valproate Sodium (DEPAKENE) solution 125 mg  125 mg Oral BID Elgergawy, Silver Huguenin, MD      . Vitamin D (Ergocalciferol) (DRISDOL) capsule 50,000 Units  50,000 Units Oral Q7 days Radene Gunning, NP         Discharge Medications: Please see discharge summary for a list of discharge medications.  Relevant Imaging Results:  Relevant Lab Results:   Additional Information SS#: [id number]  Candie Chroman, LCSW

## 2016-11-14 NOTE — Care Management Note (Addendum)
Case Management Note  Patient Details  Name: Kara Burnett MRN: [id number] Date of Birth: Feb 12, 1925  Subjective/Objective:  Patient is from memory care at [facility], they plan to return back with Hospice,  NCM called facility to see who they contract with, they said [facility],  NCM spoke with daughter Kara Burnett and informed her of this information and she states yes she would like [facility].  Referral made to [facility] at [facility].  Patient will return to [facility] with Hospice being set up at the facility.  1617   Patient BS down in 50's, patient receiving D5.  Per MD , will plan for dc tomorrow.   5/30 [place], BSN - per CSW family now looking to full comfort care, if stable enough to transfer CSW will facilitate [employer].                     Action/Plan: NCM will follow for dc needs.   Expected Discharge Date:                  Expected Discharge Plan:  Assisted Living / Rest Home  In-House Referral:  Clinical Social Work  Discharge planning Services  CM Consult  Post Acute Care Choice:  Hospice Choice offered to:  Adult Children  DME Arranged:    DME Agency:     HH Arranged:  RN [place] Agency:  [facility]  Status of Service:  Completed, signed off  If discussed at [place] of Stay Meetings, dates discussed:    Additional Comments:  Zenon Mayo, RN 11/14/2016, 2:40 PM

## 2016-11-14 NOTE — Progress Notes (Signed)
Daily Rounding Note  11/14/2016, 8:14 AM  LOS: 0 days   SUBJECTIVE:   Chief complaint: painless hematochezia.  Had brown stool without blood this AM.  Daughter not aware of recent constipation.  Reports increased somnolence after Seroquel added for agitation.   OBJECTIVE:         Vital signs in last 24 hours:    Temp:  [97 F (36.1 C)-98.2 F (36.8 C)] 97.5 F (36.4 C) (05/29 0320) Pulse Rate:  [54-63] 54 (05/29 0320) Resp:  [14-22] 15 (05/29 0320) BP: (105-134)/(52-89) 130/55 (05/29 0320) SpO2:  [95 %-99 %] 99 % (05/29 0320) Weight:  [44.9 kg (98 lb 15.8 oz)] 44.9 kg (98 lb 15.8 oz) (05/29 0500) Last BM Date: 11/13/16 Filed Weights   11/13/16 0305 11/14/16 0500  Weight: 42.6 kg (93 lb 14.7 oz) 44.9 kg (98 lb 15.8 oz)   General: somnolent, difficult to arouse.  Aged, pale, cachectic.   Heart: irreg, irreg, ? A fib per tele strip Chest: clear bil.  No labored breathing or cough.   Abdomen: soft, NT, ND.  BS active   Extremities: no CCE Neuro/Psych:  No tremor, somnolent and did not awaken to exam.    Intake/Output from previous day: 05/28 0701 - 05/29 0700 In: 1070 [P.O.:120; I.V.:950] Out: -   Intake/Output this shift: No intake/output data recorded.  Lab Results:  Recent Labs  11/12/16 1339 11/12/16 1610  11/13/16 0224 11/13/16 1042 11/13/16 1837 11/14/16 0224  WBC 14.5* 15.1*  --  9.5  --   --   --   HGB 11.4* 11.6*  < > 10.5* 10.7* 10.4* 9.8*  HCT 36.3 37.0  < > 33.9* 33.6* 33.1* 30.8*  PLT 168 170  --  154  --   --   --   < > = values in this interval not displayed. BMET  Recent Labs  11/12/16 1339 11/13/16 0224 11/14/16 0224  NA 145 142 143  K 4.0 3.5 3.5  CL 104 108 109  CO2 33* 27 29  GLUCOSE 85 109* 132*  BUN 24* 24* 22*  CREATININE 1.01* 0.98 0.87  CALCIUM 9.5 8.4* 8.3*   LFT  Recent Labs  11/12/16 1339  PROT 5.7*  ALBUMIN 2.8*  AST 21  ALT 10*  ALKPHOS 52  BILITOT  0.5   PT/INR No results for input(s): LABPROT, INR in the last 72 hours. Hepatitis Panel No results for input(s): HEPBSAG, HCVAB, HEPAIGM, HEPBIGM in the last 72 hours.  Studies/Results: No results found.   Scheduled Meds: . cholecalciferol  2,000 Units Oral Daily  . donepezil  10 mg Oral QHS  . feeding supplement  1 Container Oral TID BM   Or  . feeding supplement (ENSURE ENLIVE)  237 mL Oral TID BM  . LORazepam  0.25 mg Intravenous Once  . pantoprazole (PROTONIX) IV  40 mg Intravenous Q24H  . QUEtiapine  75 mg Oral QHS  . rosuvastatin  20 mg Oral Daily  . sodium chloride flush  3 mL Intravenous Q12H  . Valproate Sodium  250 mg Oral BID  . Vitamin D (Ergocalciferol)  50,000 Units Oral Q7 days   Continuous Infusions: PRN Meds:.acetaminophen **OR** acetaminophen, ondansetron **OR** ondansetron (ZOFRAN) IV, sodium chloride flush  ASSESMENT:   *  Hematochezia.  Resolved.  Diverticulosis, internal hemorrhoids per colonoscopy, gastritis and duodenitis per EGD of 2008   *  Blood loss anemia.  MCV 100 at arrival  *  AKI.  Improved.  BUN remains mildly elevated.   *  Dementia.  Seroquel added recently for agitation and pendulum has swung to somnolence, this is now being weaned.   *  Malnutrition.  RD suggested Boost vs Ensure.  TID Boost at [facility] but pt's somnelence    PLAN   *  CBC in AM.  Stop tele, she is NCB. Stopped IV Protonix, add daily H2 blocker for hx gastritis, duodenitis.   Advanced to chopped diet (if awake/alert enough to eat/feed).  GI signing off.    Dtr asked about hospice getting involved.  Not a bad idea to get Pall care input into mgt of pt's behavioral extremes.  They could also determine if pt is a hospice candidate.      Azucena Freed  11/14/2016, 8:14 AM Pager: [phone]

## 2016-11-14 NOTE — Care Management Obs Status (Signed)
MEDICARE OBSERVATION STATUS NOTIFICATION   Patient Details  Name: Kara Burnett MRN: [id number] Date of Birth: 1925/04/01   Medicare Observation Status Notification Given:  Yes    Zenon Mayo, RN 11/14/2016, 3:38 PM

## 2016-11-15 ENCOUNTER — Encounter (HOSPITAL_COMMUNITY): Payer: Self-pay | Admitting: Orthopedic Surgery

## 2016-11-15 DIAGNOSIS — R5383 Other fatigue: Secondary | ICD-10-CM

## 2016-11-15 DIAGNOSIS — Z515 Encounter for palliative care: Secondary | ICD-10-CM

## 2016-11-15 DIAGNOSIS — I10 Essential (primary) hypertension: Secondary | ICD-10-CM

## 2016-11-15 DIAGNOSIS — G9341 Metabolic encephalopathy: Secondary | ICD-10-CM | POA: Diagnosis not present

## 2016-11-15 DIAGNOSIS — E43 Unspecified severe protein-calorie malnutrition: Secondary | ICD-10-CM

## 2016-11-15 DIAGNOSIS — Z7189 Other specified counseling: Secondary | ICD-10-CM

## 2016-11-15 DIAGNOSIS — F0391 Unspecified dementia with behavioral disturbance: Secondary | ICD-10-CM

## 2016-11-15 DIAGNOSIS — E876 Hypokalemia: Secondary | ICD-10-CM | POA: Clinically undetermined

## 2016-11-15 LAB — CBC WITH DIFFERENTIAL/PLATELET
BASOS ABS: 0 10*3/uL (ref 0.0–0.1)
BASOS PCT: 0 %
EOS PCT: 1 %
Eosinophils Absolute: 0.1 10*3/uL (ref 0.0–0.7)
HCT: 36.9 % (ref 36.0–46.0)
Hemoglobin: 12.2 g/dL (ref 12.0–15.0)
Lymphocytes Relative: 22 %
Lymphs Abs: 1.9 10*3/uL (ref 0.7–4.0)
MCH: 32.1 pg (ref 26.0–34.0)
MCHC: 33.1 g/dL (ref 30.0–36.0)
MCV: 97.1 fL (ref 78.0–100.0)
MONO ABS: 1.2 10*3/uL — AB (ref 0.1–1.0)
Monocytes Relative: 13 %
Neutro Abs: 5.6 10*3/uL (ref 1.7–7.7)
Neutrophils Relative %: 64 %
PLATELETS: 155 10*3/uL (ref 150–400)
RBC: 3.8 MIL/uL — ABNORMAL LOW (ref 3.87–5.11)
RDW: 14.2 % (ref 11.5–15.5)
WBC: 8.8 10*3/uL (ref 4.0–10.5)

## 2016-11-15 LAB — BASIC METABOLIC PANEL
ANION GAP: 6 (ref 5–15)
BUN: 13 mg/dL (ref 6–20)
CO2: 27 mmol/L (ref 22–32)
Calcium: 8.5 mg/dL — ABNORMAL LOW (ref 8.9–10.3)
Chloride: 106 mmol/L (ref 101–111)
Creatinine, Ser: 0.82 mg/dL (ref 0.44–1.00)
GFR calc Af Amer: >60 mL/min (ref >60)
GLUCOSE: 85 mg/dL (ref 65–99)
Potassium: 3 mmol/L — ABNORMAL LOW (ref 3.5–5.1)
SODIUM: 139 mmol/L (ref 135–145)

## 2016-11-15 LAB — GLUCOSE, CAPILLARY
GLUCOSE-CAPILLARY: 95 mg/dL (ref 65–99)
GLUCOSE-CAPILLARY: 67 mg/dL (ref 65–99)
Glucose-Capillary: 92 mg/dL (ref 65–99)

## 2016-11-15 LAB — MAGNESIUM: Magnesium: 1.5 mg/dL — ABNORMAL LOW (ref 1.7–2.4)

## 2016-11-15 LAB — VALPROIC ACID LEVEL: Valproic Acid Lvl: 25 ug/mL — ABNORMAL LOW (ref 50.0–100.0)

## 2016-11-15 MED ORDER — HALOPERIDOL 0.5 MG PO TABS
0.5000 mg | ORAL_TABLET | ORAL | Status: DC | PRN
  Filled 2016-11-15: qty 1

## 2016-11-15 MED ORDER — BIOTENE DRY MOUTH MT LIQD: 15.0000 mL | OROMUCOSAL | Status: DC | PRN

## 2016-11-15 MED ORDER — HALOPERIDOL LACTATE 5 MG/ML IJ SOLN
0.5000 mg | INTRAMUSCULAR | Status: DC | PRN
Start: 2016-11-15 — End: 2016-11-16

## 2016-11-15 MED ORDER — VALPROATE SODIUM 500 MG/5ML IV SOLN
125.0000 mg | Freq: Two times a day (BID) | INTRAVENOUS | Status: DC
  Administered 2016-11-15 – 2016-11-16 (×3): 125 mg via INTRAVENOUS
  Filled 2016-11-15 (×4): qty 1.25

## 2016-11-15 MED ORDER — OLANZAPINE 5 MG PO TBDP
5.0000 mg | ORAL_TABLET | Freq: Every day | ORAL | Status: DC
  Filled 2016-11-15: qty 1

## 2016-11-15 MED ORDER — GLYCOPYRROLATE 1 MG PO TABS
1.0000 mg | ORAL_TABLET | ORAL | Status: DC | PRN
  Filled 2016-11-15: qty 1

## 2016-11-15 MED ORDER — POTASSIUM CHLORIDE 10 MEQ/100ML IV SOLN
10.0000 meq | INTRAVENOUS | Status: DC
  Administered 2016-11-15: 10 meq via INTRAVENOUS
  Filled 2016-11-15: qty 100

## 2016-11-15 MED ORDER — POLYVINYL ALCOHOL 1.4 % OP SOLN
1.0000 [drp] | Freq: Four times a day (QID) | OPHTHALMIC | Status: DC | PRN
  Filled 2016-11-15: qty 15

## 2016-11-15 MED ORDER — HALOPERIDOL LACTATE 2 MG/ML PO CONC
0.5000 mg | ORAL | Status: DC | PRN
  Filled 2016-11-15: qty 0.3

## 2016-11-15 MED ORDER — POTASSIUM CHLORIDE 2 MEQ/ML IV SOLN
INTRAVENOUS | Status: DC
  Administered 2016-11-15: 17:00:00 via INTRAVENOUS
  Filled 2016-11-15 (×2): qty 1000

## 2016-11-15 MED ORDER — GLYCOPYRROLATE 0.2 MG/ML IJ SOLN: 0.2000 mg | INTRAMUSCULAR | Status: DC | PRN

## 2016-11-15 MED ORDER — MAGNESIUM SULFATE 4 GM/100ML IV SOLN
4.0000 g | Freq: Once | INTRAVENOUS | Status: AC
  Administered 2016-11-15: 4 g via INTRAVENOUS
  Filled 2016-11-15: qty 100

## 2016-11-15 MED ORDER — POTASSIUM CHLORIDE 20 MEQ PO PACK
40.0000 meq | PACK | ORAL | Status: DC
  Filled 2016-11-15 (×2): qty 2

## 2016-11-15 NOTE — Consult Note (Signed)
Consultation Note Date: 11/15/2016   Patient Name: Kara Burnett  DOB: 1925/05/14  MRN: [id number]  Age / Sex: 83 y.o., female  PCP: Midge Minium, MD Referring Physician: Eugenie Filler, MD  Reason for Consultation: Establishing goals of care  HPI/Patient Profile: 83 y.o. female  with past medical history of dementia, arthritis, anemia who was admitted on 11/12/2016 with BRBPR.  During her hospitalization she also had atrial fibrillation with rapid ventricular response.  She stopped eating/drinking and became significantly lethargic.  After several days of work up and treatment it was concluded that the patient is at end of life with very advanced dementia..   Clinical Assessment and Goals of Care:  I have reviewed medical records including EPIC notes, labs and imaging, received report from the bedside RN, assessed the patient and then met at the bedside along with her two Burnett, [name] and [name], to discuss diagnosis prognosis, GOC, EOL wishes, disposition and options.  I introduced Palliative Medicine as specialized medical care for people living with serious illness. It focuses on providing relief from the symptoms and stress of a serious illness. The goal is to improve quality of life for both the patient and the family.  We discussed a brief life review of the patient.  She was a loving mother who enjoyed family as well as clothes and shopping.  She lived independently with her Burnett checking on her until approximately 6 months ago when she began having episodes of irrational thought and paranoia.  Her Burnett now believe this is sundowning and related to the dementia.  They feel her issues went from bad to worse when she was started on seroquel.    We talked a lot about dementia - each insult (illness, bleeding, move to SNF, etc...) will progress the dementia faster.  We also discussed  the step wise progression of dementia - thus helping to understand what the Burnett feel is a very sudden decline in their mother ("she was sitting up and eating just last week").  Finally we discussed her very low blood glucose levels which her body is unable to compensate, and she appears to have no desire to eat or drink.  Kara Burnett understand that she is near end of life.  The difference between aggressive medical intervention and comfort care was considered in light of the patient's situation. Hospice and Palliative Care services outpatient were explained and offered.  The Burnett opted for full comfort care and a transfer to [facility] when a bed is available.  Above all else they do not want their mother to suffer further.  Questions and concerns were addressed.  Hard Choices booklet left for review. The family was encouraged to call with questions or concerns.  PMT will continue to support holistically until Kara Burnett is discharged to Hospice house.   Primary Decision Maker:  NEXT OF KIN  Burnett [name] and [name]    SUMMARY OF RECOMMENDATIONS    Discontinue interventions and medications not related to comfort.  Including the  ordered U/A (at the request of the family) Implement comfort medications and measures such as oral care and delirium precautions.  Code Status/Advance Care Planning:  DNR    Symptom Management:   Will add zyprexa disintegrating tablet 5 mg at bedtime for delirium and agitation.  Will add comfort meds (haldol) PRN agitation, secretions, etc..  Additional Recommendations (Limitations, Scope, Preferences):  Full Comfort Care   Recommend d/c to Hospice House ASAP.  Family requests [facility] due to its close proximity to their home.  Palliative Prophylaxis:   Delirium Protocol  Psycho-social/Spiritual:   Desire for further Chaplaincy support:  no  Prognosis:   Hours - Days based on advanced dementia - CBGs dropping without  IV D5, bedbound, no PO intake  Discharge Planning: Hospice facility      Primary Diagnoses: Present on Admission: . Absence of bladder continence . ANEMIA, MILD . Dementia . Essential hypertension . Protein-calorie malnutrition, severe ([place]) . BRBPR (bright red blood per rectum) . Bradycardia   I have reviewed the medical record, interviewed the patient and family, and examined the patient. The following aspects are pertinent.  Past Medical History:  Diagnosis Date  . Anemia   . Arthritis   . Dementia   . GERD (gastroesophageal reflux disease)   . Herpes zoster   . Hyperlipidemia   . Hypertension   . Osteoporosis   . Retinal vein occlusion 2015   Dr.Sanders   Social History   Social History  . Marital status: Widowed    Spouse name: N/A  . Number of children: 3  . Years of education: N/A   Social History Main Topics  . Smoking status: Never Smoker  . Smokeless tobacco: Never Used  . Alcohol use No  . Drug use: No  . Sexual activity: No   Other Topics Concern  . None   Social History Narrative  . None   Family History  Problem Relation Age of Onset  . Stroke Father   . Heart disease Father        MI in 90s  . Heart disease Sister        MI in 55s  . Diabetes Sister   . Stroke Mother        coma  . Hypothyroidism Son   . Heart disease Son    Scheduled Meds: . famotidine  20 mg Oral Daily  . feeding supplement  1 Container Oral TID BM   Or  . feeding supplement (ENSURE ENLIVE)  237 mL Oral TID BM  . LORazepam  0.25 mg Intravenous Once  . OLANZapine zydis  5 mg Oral QHS  . sodium chloride flush  3 mL Intravenous Q12H   Continuous Infusions: . dextrose 5 % and 0.45% NaCl 1,000 mL with potassium chloride 40 mEq infusion    . magnesium sulfate 1 - 4 g bolus IVPB 4 g (11/15/16 1035)  . valproate sodium     PRN Meds:.acetaminophen **OR** acetaminophen, antiseptic oral rinse, glycopyrrolate **OR** glycopyrrolate **OR** glycopyrrolate, haloperidol  **OR** haloperidol **OR** haloperidol lactate, ondansetron **OR** ondansetron (ZOFRAN) IV, polyvinyl alcohol, sodium chloride flush Allergies  Allergen Reactions  . Ciprofloxacin Nausea And Vomiting   Review of Systems patient unable to provide  Physical Exam  Elderly frail woman who responds to my questions with jibberish.  Eyes closed, fetal position CV brady Resp no distress Abdomen soft, NT Ext no edema  Vital Signs: BP 140/68 (BP Location: Left Arm)   Pulse (!) 51   Temp 99.6 F (  37.6 C) (Oral)   Resp 17   Ht '5\' 2"'$  (1.575 m)   Wt 44.7 kg (98 lb 8.7 oz)   LMP  (LMP Unknown)   SpO2 100%   BMI 18.02 kg/m  Pain Assessment: PAINAD   Pain Score: Asleep   SpO2: SpO2: 100 % O2 Device:SpO2: 100 % O2 Flow Rate: .   IO: Intake/output summary:  Intake/Output Summary (Last 24 hours) at 11/15/16 1101 Last data filed at 11/14/16 1605  Gross per 24 hour  Intake                0 ml  Output                0 ml  Net                0 ml    LBM: Last BM Date: 11/14/16 Baseline Weight: Weight: 42.6 kg (93 lb 14.7 oz) Most recent weight: Weight: 44.7 kg (98 lb 8.7 oz)     Palliative Assessment/Data:   Flowsheet Rows     Most Recent Value  Intake Tab  Referral Department  Hospitalist  Unit at Time of Referral  Intermediate Care Unit  Palliative Care Primary Diagnosis  Neurology  Date Notified  11/14/16  Palliative Care Type  New Palliative care  Reason for referral  Non-pain Symptom, Counsel Regarding Hospice, Clarify Goals of Care  Date of Admission  11/12/16  Date first seen by Palliative Care  11/15/16  # of days Palliative referral response time  1 Day(s)  # of days IP prior to Palliative referral  2  Clinical Assessment  Palliative Performance Scale Score  20%  Psychosocial & Spiritual Assessment  Palliative Care Outcomes  Patient/Family meeting held?  Yes  Who was at the meeting?  patient and two Burnett  Palliative Care Outcomes  Improved non-pain symptom  therapy, Clarified goals of care, Counseled regarding hospice, Provided end of life care assistance, Changed to focus on comfort      Time In: 10:00 Time Out: 11:30 Time Total: 90 min. Greater than 50%  of this time was spent counseling and coordinating care related to the above assessment and plan.  Signed by: Imogene Burn, PA-C Palliative Medicine Pager: [phone]  Please contact Palliative Medicine Team phone at [phone] for questions and concerns.  For individual provider: See Shea Evans

## 2016-11-15 NOTE — Clinical Social Work Note (Addendum)
Patient's daughters not at bedside. CSW left Ms. Summers a Advertising account executive. Will try Ms. Minish if she does not return call soon.  Dayton Scrape, CSW [phone]  1:44 pm CSW left voicemail for Ms. Minish as well.  Dayton Scrape, [place]  3:51 pm CSW spoke with Ms. Summers at bedside. She stated she was told patient may not survive hospitalization. She is agreeable to [facility] referral in case she does survive past hours or days. Referral made to Erling Conte.  Dayton Scrape, [place]

## 2016-11-15 NOTE — Progress Notes (Signed)
PROGRESS NOTE    Kara Burnett  NKN:[id number] DOB: [birth year] DOA: 11/12/2016 PCP: Midge Minium, MD    Brief Narrative:  83 y.o.femalenursing home patient with history of dementia with behavioral issues, hypertension, hyperlipidemia, GERD, and status post cholecystectomy, came from SNF with bright red blood per rectum, with known history of diverticulosis. Bleeding subsided however due to agitation patient's Seroquel dose was increased to 75 mg daily. Patient subsequently now lethargic with minimal to no oral intake.   Assessment & Plan:   Principal Problem:   BRBPR (bright red blood per rectum) Active Problems:   Acute metabolic encephalopathy   ANEMIA, MILD   Essential hypertension   Absence of bladder continence   Dementia   Protein-calorie malnutrition, severe (H[facility])   Bradycardia   Hypokalemia   Hypomagnesemia  BRBPR/probable lower GI bleed - This is most likely secondary to diverticulosis. Most recent colonoscopy in 2008 with diverticulosis. Hemoglobin stable and improved from yesterday and currently at 12.2. Patient is not on anticoagulation. GI input greatly appreciated, plan for conservative. - No indication for procedure given stable hemoglobin, and GI bleed resolved.  Hypertension - Blood pressure stable. Oral antihypertensive medications on hold.  Hyperlipidemia - Continue with statin when patient more alert and tolerating oral intake.  Dementia - Appears to be advanced, patient remains lethargic today. Seroquel was discontinued. Depakote dose has been decreased. Continue Aricept. Palliative care consultation pending.  Atrial fibrillation - Patient developed A. fib during hospital stay, usually with RVR, currently rate controlled and back to normal sinus rhythm. Patient with heart rates in the 50s. Patient is not a candidate for an evaluation given her advanced age, she is significantly frail, and her GI bleed, at this point there is no indication  for any further workup. Patient back in normal sinus rhythm. Will follow.  Acute metabolic Encephalopathy - Patient is lethargic today as she was yesterday and difficult to arouse which may be likey related to significant baseline dementia, CT head with no acute finding, but significant for atrophy and ventriculomegaly, discussed goals of care with the daughter, they do not wish for any aggressive measures, they wish for her to go back to the facility with hospice which has been arranged, but unfortunately she remains lethargic and no by mouth intake, and developed hypoglycemia, for which she will require D5 half-normal saline infusion. Patient's family feel patient's lethargy coincided with Seroquel and increasing Seroquel dose. Seroquel has been discontinued. Depakote dose decreased. Hypoglycemia improved with D5 half-normal saline. Will check a UA with cultures and sensitivities. I/0 cath.Patient with no respiratory symptoms. Patient afebrile. Patient still lethargic with significantly poor oral intake. Palliative care consultation pending for goals of care.  Hypoglycemia - CBG 56 on 11/14/2016. Hypoglycemia felt secondary to decreased by mouth intake as  she is lethargic. CBGs have improved on D5 half-normal saline. Patient still lethargic with poor oral intake. Continue current IV fluids. Palliative care consultation pending.  Hypokalemia/hypomagnesemia Replete.  Severe protein calorie malnutrition Patient currently with lethargy with poor oral intake. If patient's mental status improves will continue nutritional supplementation.    DVT prophylaxis: SCDs. Code Status: DO NOT RESUSCITATE Family Communication: Updated daughters at bedside. Disposition Plan: Back to assisted living facility with hospice versus residential hospice pending workup of mental status and palliative care evaluation.   Consultants:   Gastroenterology: Dr. Eddie Dibbles 11/13/2016  Procedures:   CT head without  contrast 11/14/2016  Antimicrobials:  None   Subjective: Patient lethargic. Barely opens eyes to verbal and noxious  stimuli. Most noxious stimuli. Family at bedside stating patient lethargic with poor oral intake over the past 24-48 hours. Patient noted to have 3-4 brown stools over the past 24 hours.  Objective: Vitals:   11/15/16 0334 11/15/16 0500 11/15/16 0744 11/15/16 1000  BP: 140/68     Pulse: (!) 52   (!) 51  Resp: 18   17  Temp: 97.8 F (36.6 C)  98.4 F (36.9 C) 99.6 F (37.6 C)  TempSrc: Axillary  Axillary Oral  SpO2: 98%   100%  Weight:  44.7 kg (98 lb 8.7 oz)    Height:        Intake/Output Summary (Last 24 hours) at 11/15/16 1009 Last data filed at 11/14/16 1605  Gross per 24 hour  Intake                0 ml  Output                0 ml  Net                0 ml   Filed Weights   11/13/16 0305 11/14/16 0500 11/15/16 0500  Weight: 42.6 kg (93 lb 14.7 oz) 44.9 kg (98 lb 15.8 oz) 44.7 kg (98 lb 8.7 oz)    Examination:  General exam: lethargic.Dry mucous membranes Respiratory system: Clear to auscultation. Respiratory effort normal. Cardiovascular system: S1 & S2 heard, bradycardia. No JVD, murmurs, rubs, gallops or clicks. No pedal edema. Gastrointestinal system: Abdomen is nondistended, soft and nontender. No organomegaly or masses felt. Normal bowel sounds heard. Central nervous system: barely opens eyes to verbal and noxious stimuli.Moving extremities spontaneously.  Extremities: Symmetric 5 x 5 power. Skin: No rashes, lesions or ulcers Psychiatry: Judgement and insight appear poor. Mood & affect flat.    Data Reviewed: I have personally reviewed following labs and imaging studies  CBC:  Recent Labs Lab 11/12/16 1339 11/12/16 1610  11/13/16 0224 11/13/16 1042 11/13/16 1837 11/14/16 0224 11/15/16 0830  WBC 14.5* 15.1*  --  9.5  --   --   --  8.8  NEUTROABS 12.0*  --   --   --   --   --   --  5.6  HGB 11.4* 11.6*  < > 10.5* 10.7* 10.4* 9.8*  12.2  HCT 36.3 37.0  < > 33.9* 33.6* 33.1* 30.8* 36.9  MCV 100.8* 100.8*  --  100.6*  --   --   --  97.1  PLT 168 170  --  154  --   --   --  155  < > = values in this interval not displayed. Basic Metabolic Panel:  Recent Labs Lab 11/12/16 1339 11/13/16 0224 11/14/16 0224 11/15/16 0830  NA 145 142 143 139  K 4.0 3.5 3.5 3.0*  CL 104 108 109 106  CO2 33* 27 29 27   GLUCOSE 85 109* 132* 85  BUN 24* 24* 22* 13  CREATININE 1.01* 0.98 0.87 0.82  CALCIUM 9.5 8.4* 8.3* 8.5*  MG  --   --   --  1.5*   GFR: Estimated Creatinine Clearance: 32.2 mL/min (by C-G formula based on SCr of 0.82 mg/dL). Liver Function Tests:  Recent Labs Lab 11/12/16 1339  AST 21  ALT 10*  ALKPHOS 52  BILITOT 0.5  PROT 5.7*  ALBUMIN 2.8*   No results for input(s): LIPASE, AMYLASE in the last 168 hours. No results for input(s): AMMONIA in the last 168 hours. Coagulation Profile: No results for  input(s): INR, PROTIME in the last 168 hours. Cardiac Enzymes:  Recent Labs Lab 11/13/16 0224  TROPONINI <0.03   BNP (last 3 results) No results for input(s): PROBNP in the last 8760 hours. HbA1C: No results for input(s): HGBA1C in the last 72 hours. CBG:  Recent Labs Lab 11/14/16 1639 11/14/16 1949 11/14/16 2331 11/15/16 0336 11/15/16 0753  GLUCAP 80 93 85 92 95   Lipid Profile: No results for input(s): CHOL, HDL, LDLCALC, TRIG, CHOLHDL, LDLDIRECT in the last 72 hours. Thyroid Function Tests: No results for input(s): TSH, T4TOTAL, FREET4, T3FREE, THYROIDAB in the last 72 hours. Anemia Panel: No results for input(s): VITAMINB12, FOLATE, FERRITIN, TIBC, IRON, RETICCTPCT in the last 72 hours. Sepsis Labs:  Recent Labs Lab 11/13/16 0224  LATICACIDVEN 1.9    Recent Results (from the past 240 hour(s))  MRSA PCR Screening     Status: None   Collection Time: 11/13/16  3:05 AM  Result Value Ref Range Status   MRSA by PCR NEGATIVE NEGATIVE Final    Comment:        The GeneXpert MRSA Assay  (FDA approved for NASAL specimens only), is one component of a comprehensive MRSA colonization surveillance program. It is not intended to diagnose MRSA infection nor to guide or monitor treatment for MRSA infections.          Radiology Studies: Ct Head Wo Contrast  Result Date: 11/14/2016 CLINICAL DATA:  83 year old female with lethargy. History of dementia. Initial encounter. EXAM: CT HEAD WITHOUT CONTRAST TECHNIQUE: Contiguous axial images were obtained from the base of the skull through the vertex without intravenous contrast. COMPARISON:  09/13/2016. FINDINGS: Brain: No intracranial hemorrhage or CT evidence of large acute infarct. Moderate chronic microvascular changes. Moderate global atrophy without hydrocephalus. No intracranial mass lesion noted on this unenhanced exam. Vascular: Vascular calcifications Skull: No acute abnormality Sinuses/Orbits: No acute orbital abnormality. Post lens replacement. Visualized paranasal sinuses are clear. Other: Mastoid air cells and middle ear cavities are clear. IMPRESSION: No acute intracranial abnormality. Chronic microvascular changes. Atrophy. Electronically Signed   By: Genia Del M.D.   On: 11/14/2016 13:57        Scheduled Meds: . cholecalciferol  2,000 Units Oral Daily  . donepezil  10 mg Oral QHS  . famotidine  20 mg Oral Daily  . feeding supplement  1 Container Oral TID BM   Or  . feeding supplement (ENSURE ENLIVE)  237 mL Oral TID BM  . LORazepam  0.25 mg Intravenous Once  . rosuvastatin  20 mg Oral Daily  . sodium chloride flush  3 mL Intravenous Q12H  . Valproate Sodium  125 mg Oral BID  . Vitamin D (Ergocalciferol)  50,000 Units Oral Q7 days   Continuous Infusions: . dextrose 5 % and 0.45% NaCl 1,000 mL with potassium chloride 40 mEq infusion    . magnesium sulfate 1 - 4 g bolus IVPB    . potassium chloride       LOS: 1 day    Time spent: 29 mins    THOMPSON,DANIEL, MD [facility] Pager [phone]  [phone]  If 7PM-7AM, please contact night-coverage [email or web address] Password [email or web address] 11/15/2016, 10:09 AM

## 2016-11-15 NOTE — Consult Note (Signed)
[facility] [employer] Received request from [place] for family interest in [facility]. Chart reviewed and met briefly with family members at bedside. They shared their understanding is patient will transfer to another unit this afternoon. Will follow up with family in the morning.  Thank you,  Erling Conte, LCSW [phone]

## 2016-11-16 DIAGNOSIS — Z7189 Other specified counseling: Secondary | ICD-10-CM

## 2016-11-16 DIAGNOSIS — F0281 Dementia in other diseases classified elsewhere with behavioral disturbance: Secondary | ICD-10-CM

## 2016-11-16 MED ORDER — OLANZAPINE 5 MG PO TBDP: 5.0000 mg | ORAL_TABLET | Freq: Every day | ORAL | 0 refills | Status: AC

## 2016-11-16 MED ORDER — GLYCOPYRROLATE 1 MG PO TABS: 1.0000 mg | ORAL_TABLET | ORAL | Status: AC | PRN

## 2016-11-16 MED ORDER — FAMOTIDINE 20 MG PO TABS: 20.0000 mg | ORAL_TABLET | Freq: Every day | ORAL | Status: AC

## 2016-11-16 MED ORDER — VALPROATE SODIUM 250 MG/5ML PO SOLN: 125.0000 mg | Freq: Two times a day (BID) | ORAL | 0 refills | Status: AC

## 2016-11-16 MED ORDER — HALOPERIDOL LACTATE 2 MG/ML PO CONC: 0.5000 mg | ORAL | 0 refills | Status: AC | PRN

## 2016-11-16 NOTE — Clinical Social Work Note (Signed)
Patient has a bed at [facility] today. MD aware.  Dayton Scrape, [place]

## 2016-11-16 NOTE — Progress Notes (Signed)
[facility] here to pick up patient at this time.

## 2016-11-16 NOTE — Clinical Social Work Note (Signed)
CSW facilitated patient discharge including contacting patient family and facility to confirm patient discharge plans. Clinical information faxed to facility and family agreeable with plan. CSW arranged ambulance transport via [facility] to [facility]. RN to call report prior to discharge ([phone]).  CSW will sign off for now as social work intervention is no longer needed. Please consult us again if new needs arise.  Mary Hockey, CSW [phone]  

## 2016-11-16 NOTE — Progress Notes (Signed)
Daily Progress Note   Patient Name: Kara Burnett       Date: 11/16/2016 DOB: 12/03/1924  Age: 83 y.o. MRN#: [id number] Attending Physician: Eugenie Filler, MD Primary Care Physician: Midge Minium, MD Admit Date: 11/12/2016  Reason for Consultation/Follow-up: Psychosocial/spiritual support and Terminal Care  Subjective: Patient unable to speak with me.  I visited the room twice.  Early morning I spoke with dtr [name].  We discussed discontinuing the telemetry and IVF.  We also discussed her mother's very short prognosis.   She told me her mother had a good night.  Family had visited and Mrs. Baskett had even perked up during a skype call with her grandson from [place].  [name] expressed complicated grief about her mothers recent psychological changes - paranoia, raising her voice to her daughters and not trusting them.  I tried to comfort her.  She discussed all the effort she put into finding the best memory care unit and decorating it well for her mother.  Later in the morning I spoke with Webb Silversmith.  Webb Silversmith felt her mother was very comfortable.  She was tearful.  She was concerned about her mother changing rooms.  Anne relayed that her husband had died not long ago in the hospital as he was being transferred between rooms.  We discussed how comfortable Mrs. Ressel was currently.   Assessment: Patient actively dying.  Now off of D5 and telemetry.  Appears very comfortable "sleeping like a baby"  Patient Profile/HPI: 83 y.o. female  with past medical history of dementia, arthritis, anemia who was admitted on 11/12/2016 with BRBPR.  During her hospitalization she also had atrial fibrillation with rapid ventricular response.  She stopped eating/drinking and became significantly lethargic.   After several days of work up and treatment it was concluded that the patient is at end of life with very advanced dementia..    Length of Stay: 2  Current Medications: Scheduled Meds:  . famotidine  20 mg Oral Daily  . feeding supplement  1 Container Oral TID BM   Or  . feeding supplement (ENSURE ENLIVE)  237 mL Oral TID BM  . LORazepam  0.25 mg Intravenous Once  . OLANZapine zydis  5 mg Oral QHS  . sodium chloride flush  3 mL Intravenous Q12H    Continuous Infusions: .  valproate sodium 125 mg (11/16/16 1134)    PRN Meds: acetaminophen **OR** acetaminophen, antiseptic oral rinse, glycopyrrolate **OR** glycopyrrolate **OR** glycopyrrolate, haloperidol **OR** haloperidol **OR** haloperidol lactate, ondansetron **OR** ondansetron (ZOFRAN) IV, polyvinyl alcohol, sodium chloride flush  Physical Exam        Elderly female (pretty red hair).  Sleeping comfortably, lying in the fetal position Cv brady resp no distress 3rd spacing of fluid evident in upper extremities.   Vital Signs: BP (!) 120/50 (BP Location: Right Arm)   Pulse (!) 53   Temp 98 F (36.7 C) (Axillary)   Resp 18   Ht 5\' 2"  (1.575 m)   Wt 44.7 kg (98 lb 8.7 oz)   LMP  (LMP Unknown)   SpO2 98%   BMI 18.02 kg/m  SpO2: SpO2: 98 % O2 Device: O2 Device: Not Delivered O2 Flow Rate:    Intake/output summary:   Intake/Output Summary (Last 24 hours) at 11/16/16 1440 Last data filed at 11/16/16 1134  Gross per 24 hour  Intake           124.58 ml  Output                0 ml  Net           124.58 ml   LBM: Last BM Date: 11/15/16 Baseline Weight: Weight: 42.6 kg (93 lb 14.7 oz) Most recent weight: Weight: 44.7 kg (98 lb 8.7 oz)       Palliative Assessment/Data:    Flowsheet Rows     Most Recent Value  Intake Tab  Referral Department  Hospitalist  Unit at Time of Referral  Intermediate Care Unit  Palliative Care Primary Diagnosis  Neurology  Date Notified  11/14/16  Palliative Care Type  New Palliative  care  Reason for referral  Non-pain Symptom, Counsel Regarding Hospice, Clarify Goals of Care  Date of Admission  11/12/16  Date first seen by Palliative Care  11/15/16  # of days Palliative referral response time  1 Day(s)  # of days IP prior to Palliative referral  2  Clinical Assessment  Palliative Performance Scale Score  10%  Psychosocial & Spiritual Assessment  Palliative Care Outcomes  Patient/Family meeting held?  Yes  Who was at the meeting?  patient and two daughters  Palliative Care Outcomes  Improved non-pain symptom therapy, Clarified goals of care, Counseled regarding hospice, Provided end of life care assistance, Changed to focus on comfort      Patient Active Problem List   Diagnosis Date Noted  . Hypokalemia 11/15/2016  . Acute metabolic encephalopathy 18/29/9371  . Hypomagnesemia 11/15/2016  . Lethargy   . Palliative care encounter   . Goals of care, counseling/discussion   . BRBPR (bright red blood per rectum) 11/12/2016  . Bradycardia 11/12/2016  . Homicidal ideation   . Involuntary commitment   . Protein-calorie malnutrition, severe ([place]) 10/13/2015  . Edema of right foot 10/13/2015  . Dementia 06/15/2015  . Physical exam 04/14/2015  . Paranoia ([place]) 04/14/2015  . Acute bronchitis 04/18/2014  . Absence of bladder continence 04/18/2014  . Atypical nevus of neck 10/01/2013  . Acute upper respiratory infections of unspecified site 10/16/2012  . Bilateral conjunctivitis 10/16/2012  . Chest rales 10/16/2012  . Allergic rhinitis, cause unspecified 10/16/2012  . Hypercalcemia 04/10/2012  . Vitamin D deficiency 05/09/2010  . ANEMIA, MILD 05/09/2010  . RESTLESS LEG SYNDROME 05/09/2010  . FATIGUE 11/16/2009  . POSTURAL LIGHTHEADEDNESS 02/01/2009  . Other specified disease of white blood cells  10/01/2008  . Depression 03/26/2008  . Pernicious anemia 07/05/2007  . CERVICAL RADICULOPATHY 07/05/2007  . Hyperlipidemia 04/06/2007  . Essential hypertension  04/06/2007  . GERD 04/06/2007  . Osteoporosis 04/06/2007    Palliative Care Plan    Recommendations/Plan:  D/c tele, IVF.   Full comfort  D/c to hospice house when bed available.  If BP is full please transfer to 6N  Goals of Care and Additional Recommendations:  Limitations on Scope of Treatment: Full Comfort Care  Code Status:  DNR  Prognosis:   Hours - Days   Discharge Planning:  Hospice facility  Care plan was discussed with daughters  Thank you for allowing the Palliative Medicine Team to assist in the care of this patient.  Total time spent:  35     Greater than 50%  of this time was spent counseling and coordinating care related to the above assessment and plan.  Imogene Burn, PA-C Palliative Medicine  Please contact Palliative MedicineTeam phone at [phone] for questions and concerns between 7 am - 7 pm.   Please see AMION for individual provider pager numbers.

## 2016-11-16 NOTE — Progress Notes (Signed)
Report given to [name], Therapist, sports at [facility].  Waiting for PTAR at this time.

## 2016-11-16 NOTE — Discharge Summary (Signed)
Physician Discharge Summary  Kara Burnett JGO:[id number] DOB: [birth year] DOA: 11/12/2016  PCP: Midge Minium, MD  Admit date: 11/12/2016 Discharge date: 11/16/2016  Time spent: 50 minutes  Recommendations for Outpatient Follow-up:  1. Patient will be discharged to [facility]. Patient will follow-up with M.D. at [facility].   Discharge Diagnoses:  Principal Problem:   BRBPR (bright red blood per rectum) Active Problems:   Acute metabolic encephalopathy   ANEMIA, MILD   Essential hypertension   Absence of bladder continence   Dementia   Protein-calorie malnutrition, severe (HCC)   Bradycardia   Hypokalemia   Hypomagnesemia   Lethargy   Palliative care encounter   Goals of care, counseling/discussion   Discharge Condition: Stable  Diet recommendation: Comfort feeds  Filed Weights   11/13/16 0305 11/14/16 0500 11/15/16 0500  Weight: 42.6 kg (93 lb 14.7 oz) 44.9 kg (98 lb 15.8 oz) 44.7 kg (98 lb 8.7 oz)    History of present illness:  Per Dr Ander Gaster is a 83 y.o. female with medical history significant of dementia, hypertension, osteoporosis, hyperlipidemia presented to the emergency department from nursing facility chief complaint of bright red blood per rectum. [facility] asked to admit for evaluation of GI bleed.  Daughters at the bedside. She stated that patient's current mental status was at baseline. She is mostly bedbound but did respond to physical therapy when able. She can make her basic wants and needs known. She is incontinent of bowel and bladder and does need assistance with feedings. She recognizes family members but will not call her name. There had been no reports of any diarrhea nausea vomiting. No complaints of abdominal pain no abdominal distention. No reports of fever cough or change in mentation. Patient was not on any anticoagulants.  Chart review indicated patient had a colonoscopy 10 years ago did show some diverticulosis and  internal hemorrhoids.  ED Course: In the emergency department she's afebrile hemodynamically stable and not hypoxic.  Hospital Course:  BRBPR/probable lower GI bleed - This was most likely secondary to diverticulosis. Most recent colonoscopy in 2008 with diverticulosis. Hemoglobin stable at 12.2. Patient was not on anticoagulation. GI input greatly appreciated, plan for conservative. - No indication for procedure given stable hemoglobin, and GI bleed resolved. - Patient has been assessed by palliative care and patient now for comfort measures.  - Patient will be transferred to residential hospice home at [facility].   Hypertension - Blood pressure stable. Oral antihypertensive medications on hold.  Hyperlipidemia - Discontinued statin. Patient now full comfort.   Dementia - Appears to be advanced, patient remains lethargic with poor to no oral intake over the past few days. Patient likely approaching end-of-life. Seroquel was discontinued. Depakote dose has been decreased. Aricept discontinued. Patient has been assessed by palliative care and patient now for comfort measures.  Patient to be discharged to residential hospice home/[facility].  Atrial fibrillation - Patient developed A. fib during hospital stay, usually with RVR, currently rate controlled and back to normal sinus rhythm. Patient with heart rates in the 50s. Patient was deemed not a candidate for an evaluation given her advanced age, she was significantly frail, and her GI bleed, at this point there is no indication for any further workup. Patient back in normal sinus rhythm. Patient now full comfort measures.  Acute metabolic Encephalopathy - Patient is lethargic today as she has been over the past 2-3 days and minimally responsive. Patient also with poor oral intake. Likely related  to worsening baseline dementia. CT head with no acute finding, but significant for atrophy and ventriculomegaly, discussed goals of  care with the daughter, they do not wish for any aggressive measures, they wish for her to go back to the facility with hospice which has been arranged, but unfortunately she remains lethargic and no by mouth intake, and developed hypoglycemia, for which she will require D5 half-normal saline infusion. Patient's family feel patient's lethargy coincided with Seroquel and increasing Seroquel dose. Seroquel has been discontinued. Depakote dose decreased. Hypoglycemia improved with D5 half-normal saline. Palliative care have met with family and goals of care discussed and patient now full comfort measures. UA with cultures and sensitivities were discontinued. No more lab draws. Continue decreased dose of IV Depakote. Zyprexa also added at bedtime per palliative care.  Patient will be discharged to residential hospice home/ [facility].   Hypoglycemia - CBG 56 on 11/14/2016. Hypoglycemia felt secondary to decreased by mouth intake as she was lethargic. CBGs improved on D5 half-normal saline. Patient still remained lethargic with poor oral intake. Palliative care consultation was done on 11/15/2016 and patient now full comfort measures.  Patient will be discharged to residential hospice home at [facility].   Hypokalemia/hypomagnesemia Repleted. No more lab draws as patient is now full comfort measures.  Severe protein calorie malnutrition Patient currently with lethargy with poor oral intake. Patient with no significant improvement in her mental status. Patient has been seen by palliative care and patient now full comfort measures. Comfort feeds.  Patient will be transferred to be complex.    Procedures:  CT head without contrast 11/14/2016  Consultations:  Gastroenterology: Dr. Eddie Dibbles 11/13/2016  Palliative care: Dr. Rowe Pavy 11/15/2016  Discharge Exam: Vitals:   11/15/16 1924 11/16/16 0737  BP: (!) 136/30   Pulse: (!) 53   Resp: 18   Temp: 98.1 F (36.7 C) 98 F (36.7 C)     General: Minimally responsive Cardiovascular: Bradycardia Respiratory: CTAB  Discharge Instructions   Discharge Instructions    Diet general    Complete by:  As directed    Comfort feeds.   Increase activity slowly    Complete by:  As directed      Current Discharge Medication List    START taking these medications   Details  famotidine (PEPCID) 20 MG tablet Take 1 tablet (20 mg total) by mouth daily.    glycopyrrolate (ROBINUL) 1 MG tablet Take 1 tablet (1 mg total) by mouth every 4 (four) hours as needed (excessive secretions).    haloperidol (HALDOL) 2 MG/ML solution Place 0.3 mLs (0.6 mg total) under the tongue every 4 (four) hours as needed for agitation (or delirium). Qty: 120 mL, Refills: 0    OLANZapine zydis (ZYPREXA) 5 MG disintegrating tablet Take 1 tablet (5 mg total) by mouth at bedtime. Qty: 20 tablet, Refills: 0      CONTINUE these medications which have CHANGED   Details  Valproate Sodium (DEPAKENE) 250 MG/5ML SOLN solution Take 2.5 mLs (125 mg total) by mouth 2 (two) times daily. Qty: 30 mL, Refills: 0      STOP taking these medications     aspirin EC 81 MG tablet      benazepril (LOTENSIN) 10 MG tablet      Cholecalciferol 2000 units CAPS      Cyanocobalamin (VITAMIN B-12 IJ)      donepezil (ARICEPT) 10 MG tablet      Nutritional Supplements (BOOST PLUS PO)      QUEtiapine (SEROQUEL)  50 MG tablet      rosuvastatin (CRESTOR) 20 MG tablet      traMADol (ULTRAM) 50 MG tablet      Vitamin D, Ergocalciferol, (DRISDOL) 50000 units CAPS capsule        Allergies  Allergen Reactions  . Seroquel [Quetiapine] Other (See Comments)    Delirium  . Ciprofloxacin Nausea And Vomiting   Follow-up Information    MD AT [facility] Follow up.            The results of significant diagnostics from this hospitalization (including imaging, microbiology, ancillary and laboratory) are listed below for reference.    Significant Diagnostic  Studies: Ct Head Wo Contrast  Result Date: 11/14/2016 CLINICAL DATA:  83 year old female with lethargy. History of dementia. Initial encounter. EXAM: CT HEAD WITHOUT CONTRAST TECHNIQUE: Contiguous axial images were obtained from the base of the skull through the vertex without intravenous contrast. COMPARISON:  09/13/2016. FINDINGS: Brain: No intracranial hemorrhage or CT evidence of large acute infarct. Moderate chronic microvascular changes. Moderate global atrophy without hydrocephalus. No intracranial mass lesion noted on this unenhanced exam. Vascular: Vascular calcifications Skull: No acute abnormality Sinuses/Orbits: No acute orbital abnormality. Post lens replacement. Visualized paranasal sinuses are clear. Other: Mastoid air cells and middle ear cavities are clear. IMPRESSION: No acute intracranial abnormality. Chronic microvascular changes. Atrophy. Electronically Signed   By: Genia Del M.D.   On: 11/14/2016 13:57    Microbiology: Recent Results (from the past 240 hour(s))  MRSA PCR Screening     Status: None   Collection Time: 11/13/16  3:05 AM  Result Value Ref Range Status   MRSA by PCR NEGATIVE NEGATIVE Final    Comment:        The GeneXpert MRSA Assay (FDA approved for NASAL specimens only), is one component of a comprehensive MRSA colonization surveillance program. It is not intended to diagnose MRSA infection nor to guide or monitor treatment for MRSA infections.      Labs: Basic Metabolic Panel:  Recent Labs Lab 11/12/16 1339 11/13/16 0224 11/14/16 0224 11/15/16 0830  NA 145 142 143 139  K 4.0 3.5 3.5 3.0*  CL 104 108 109 106  CO2 33* _0 GLUCOSE 85 109* 132* 85  BUN 24* 24* 22* 13  CREATININE 1.01* 0.98 0.87 0.82  CALCIUM 9.5 8.4* 8.3* 8.5*  MG  --   --   --  1.5*   Liver Function Tests:  Recent Labs Lab 11/12/16 1339  AST 21  ALT 10*  ALKPHOS 52  BILITOT 0.5  PROT 5.7*  ALBUMIN 2.8*   No results for input(s): LIPASE, AMYLASE in the  last 168 hours. No results for input(s): AMMONIA in the last 168 hours. CBC:  Recent Labs Lab 11/12/16 1339 11/12/16 1610  11/13/16 0224 11/13/16 1042 11/13/16 1837 11/14/16 0224 11/15/16 0830  WBC 14.5* 15.1*  --  9.5  --   --   --  8.8  NEUTROABS 12.0*  --   --   --   --   --   --  5.6  HGB 11.4* 11.6*  < > 10.5* 10.7* 10.4* 9.8* 12.2  HCT 36.3 37.0  < > 33.9* 33.6* 33.1* 30.8* 36.9  MCV 100.8* 100.8*  --  100.6*  --   --   --  97.1  PLT 168 170  --  154  --   --   --  155  < > = values in this interval not displayed. Cardiac Enzymes:  Recent Labs Lab 11/13/16 0224  TROPONINI <0.03   BNP: BNP (last 3 results) No results for input(s): BNP in the last 8760 hours.  ProBNP (last 3 results) No results for input(s): PROBNP in the last 8760 hours.  CBG:  Recent Labs Lab 11/14/16 1949 11/14/16 2331 11/15/16 0336 11/15/16 0753 11/15/16 1142  GLUCAP 93 85 92 95 67       Signed:  Geronimo Diliberto MD.  [facility] 11/16/2016, 12:01 PM

## 2016-11-16 NOTE — Progress Notes (Signed)
PROGRESS NOTE    Kara Burnett  PYP:[id number] DOB: 12/21/1924 DOA: 11/12/2016 PCP: Midge Minium, MD    Brief Narrative:  83 y.o.femalenursing home patient with history of dementia with behavioral issues, hypertension, hyperlipidemia, GERD, and status post cholecystectomy, came from SNF with bright red blood per rectum, with known history of diverticulosis. Bleeding subsided however due to agitation patient's Seroquel dose was increased to 75 mg daily. Patient subsequently now lethargic with minimal to no oral intake. Patient has been assessed by palliative care and patient now for comfort measures.  Assessment & Plan:   Principal Problem:   BRBPR (bright red blood per rectum) Active Problems:   Acute metabolic encephalopathy   ANEMIA, MILD   Essential hypertension   Absence of bladder continence   Dementia   Protein-calorie malnutrition, severe (HCC)   Bradycardia   Hypokalemia   Hypomagnesemia   Lethargy   Palliative care encounter   Goals of care, counseling/discussion  BRBPR/probable lower GI bleed - This is most likely secondary to diverticulosis. Most recent colonoscopy in 2008 with diverticulosis. Hemoglobin stable at 12.2. Patient is not on anticoagulation. GI input greatly appreciated, plan for conservative. - No indication for procedure given stable hemoglobin, and GI bleed resolved. - Patient has been assessed by palliative care and patient now for comfort measures.    Hypertension - Blood pressure stable. Oral antihypertensive medications on hold.  Hyperlipidemia - Discontinue statin. Patient now full comfort.   Dementia - Appears to be advanced, patient remains lethargic with poor to no oral intake over the past few days. Patient likely approaching end-of-life. Seroquel was discontinued. Depakote dose has been decreased. Aricept discontinued. Patient has been assessed by palliative care and patient now for comfort measures.   Atrial  fibrillation - Patient developed A. fib during hospital stay, usually with RVR, currently rate controlled and back to normal sinus rhythm. Patient with heart rates in the 50s. Patient is not a candidate for an evaluation given her advanced age, she is significantly frail, and her GI bleed, at this point there is no indication for any further workup. Patient back in normal sinus rhythm. Patient now full comfort measures.  Acute metabolic Encephalopathy - Patient is lethargic today as she has been over the past 2-3 days and minimally responsive. Patient also with poor oral intake. Likely related to worsening baseline dementia. CT head with no acute finding, but significant for atrophy and ventriculomegaly, discussed goals of care with the daughter, they do not wish for any aggressive measures, they wish for her to go back to the facility with hospice which has been arranged, but unfortunately she remains lethargic and no by mouth intake, and developed hypoglycemia, for which she will require D5 half-normal saline infusion. Patient's family feel patient's lethargy coincided with Seroquel and increasing Seroquel dose. Seroquel has been discontinued. Depakote dose decreased. Hypoglycemia improved with D5 half-normal saline. Palliative care have met with family and goals of care discussed and patient now full comfort measures. UA with cultures and sensitivities were discontinued. No more lab draws. Continue decreased dose of IV Depakote. Zyprexa also added at bedtime per palliative care. Awaiting transfer to residential hospice home.  Hypoglycemia - CBG 56 on 11/14/2016. Hypoglycemia felt secondary to decreased by mouth intake as  she is lethargic. CBGs have improved on D5 half-normal saline. Patient still lethargic with poor oral intake. Continue current IV fluids. Palliative care consultation was done on 11/15/2016 and patient now full comfort measures. Awaiting residential hospice home versus in  hospitalization death.  Hypokalemia/hypomagnesemia Repleted. No more lab draws as patient is now full comfort measures.  Severe protein calorie malnutrition Patient currently with lethargy with poor oral intake. Patient with no significant improvement in her mental status. Patient has been seen by palliative care and patient now full comfort measures. Comfort feeds.     DVT prophylaxis: SCDs. Code Status: DO NOT RESUSCITATE Family Communication: Updated daughter at bedside. Disposition Plan: Transfer to MedSurg/palliative floor. Residential hospice home when bed available.   Consultants:   Gastroenterology: Dr. Eddie Dibbles 11/13/2016  Palliative care: Dr. Rowe Pavy 11/15/2016  Procedures:   CT head without contrast 11/14/2016  Antimicrobials:  None   Subjective: Patient lethargic. [place]ot opens eyes to verbal and noxious stimuli. Patient moaning with some incomprehensible speech when spoken to. Per family patient's son called from [place] and patient opened her eyes and seemed to perk up a little bit and then back to sleeping. Patient looks comfortable.  Objective: Vitals:   11/15/16 1144 11/15/16 1609 11/15/16 1924 11/16/16 0737  BP: (!) 150/71 114/61 (!) 136/30   Pulse:   (!) 53   Resp:   18   Temp: (!) 89.4 F (31.9 C) (!) 96 F (35.6 C) 98.1 F (36.7 C) 98 F (36.7 C)  TempSrc: Axillary Axillary Axillary Axillary  SpO2:  99% 98%   Weight:      Height:        Intake/Output Summary (Last 24 hours) at 11/16/16 0935 Last data filed at 11/15/16 1700  Gross per 24 hour  Intake            74.58 ml  Output                0 ml  Net            74.58 ml   Filed Weights   11/13/16 0305 11/14/16 0500 11/15/16 0500  Weight: 42.6 kg (93 lb 14.7 oz) 44.9 kg (98 lb 15.8 oz) 44.7 kg (98 lb 8.7 oz)    Examination:  General exam: lethargic.Dry mucous membranes Respiratory system: Clear to auscultation. Respiratory effort normal. Cardiovascular system: S1 & S2 heard, bradycardia.  No JVD, murmurs, rubs, gallops or clicks. No pedal edema. Gastrointestinal system: Abdomen is nondistended, soft and nontender. No organomegaly or masses felt. Normal bowel sounds heard. Central nervous system: Doesnot opens eyes to verbal and noxious stimuli. Extremities: Symmetric 5 x 5 power. Skin: No rashes, lesions or ulcers Psychiatry: Judgement and insight appear poor. Mood & affect flat.    Data Reviewed: I have personally reviewed following labs and imaging studies  CBC:  Recent Labs Lab 11/12/16 1339 11/12/16 1610  11/13/16 0224 11/13/16 1042 11/13/16 1837 11/14/16 0224 11/15/16 0830  WBC 14.5* 15.1*  --  9.5  --   --   --  8.8  NEUTROABS 12.0*  --   --   --   --   --   --  5.6  HGB 11.4* 11.6*  < > 10.5* 10.7* 10.4* 9.8* 12.2  HCT 36.3 37.0  < > 33.9* 33.6* 33.1* 30.8* 36.9  MCV 100.8* 100.8*  --  100.6*  --   --   --  97.1  PLT 168 170  --  154  --   --   --  155  < > = values in this interval not displayed. Basic Metabolic Panel:  Recent Labs Lab 11/12/16 1339 11/13/16 0224 11/14/16 0224 11/15/16 0830  NA 145 142 143 139  K 4.0 3.5 3.5 3.0*  CL 104 108 109 106  CO2 33* '27 29 27  '$ GLUCOSE 85 109* 132* 85  BUN 24* 24* 22* 13  CREATININE 1.01* 0.98 0.87 0.82  CALCIUM 9.5 8.4* 8.3* 8.5*  MG  --   --   --  1.5*   GFR: Estimated Creatinine Clearance: 32.2 mL/min (by C-G formula based on SCr of 0.82 mg/dL). Liver Function Tests:  Recent Labs Lab 11/12/16 1339  AST 21  ALT 10*  ALKPHOS 52  BILITOT 0.5  PROT 5.7*  ALBUMIN 2.8*   No results for input(s): LIPASE, AMYLASE in the last 168 hours. No results for input(s): AMMONIA in the last 168 hours. Coagulation Profile: No results for input(s): INR, PROTIME in the last 168 hours. Cardiac Enzymes:  Recent Labs Lab 11/13/16 0224  TROPONINI <0.03   BNP (last 3 results) No results for input(s): PROBNP in the last 8760 hours. HbA1C: No results for input(s): HGBA1C in the last 72  hours. CBG:  Recent Labs Lab 11/14/16 1949 11/14/16 2331 11/15/16 0336 11/15/16 0753 11/15/16 1142  GLUCAP 93 85 92 95 67   Lipid Profile: No results for input(s): CHOL, HDL, LDLCALC, TRIG, CHOLHDL, LDLDIRECT in the last 72 hours. Thyroid Function Tests: No results for input(s): TSH, T4TOTAL, FREET4, T3FREE, THYROIDAB in the last 72 hours. Anemia Panel: No results for input(s): VITAMINB12, FOLATE, FERRITIN, TIBC, IRON, RETICCTPCT in the last 72 hours. Sepsis Labs:  Recent Labs Lab 11/13/16 0224  LATICACIDVEN 1.9    Recent Results (from the past 240 hour(s))  MRSA PCR Screening     Status: None   Collection Time: 11/13/16  3:05 AM  Result Value Ref Range Status   MRSA by PCR NEGATIVE NEGATIVE Final    Comment:        The GeneXpert MRSA Assay (FDA approved for NASAL specimens only), is one component of a comprehensive MRSA colonization surveillance program. It is not intended to diagnose MRSA infection nor to guide or monitor treatment for MRSA infections.          Radiology Studies: Ct Head Wo Contrast  Result Date: 11/14/2016 CLINICAL DATA:  83 year old female with lethargy. History of dementia. Initial encounter. EXAM: CT HEAD WITHOUT CONTRAST TECHNIQUE: Contiguous axial images were obtained from the base of the skull through the vertex without intravenous contrast. COMPARISON:  09/13/2016. FINDINGS: Brain: No intracranial hemorrhage or CT evidence of large acute infarct. Moderate chronic microvascular changes. Moderate global atrophy without hydrocephalus. No intracranial mass lesion noted on this unenhanced exam. Vascular: Vascular calcifications Skull: No acute abnormality Sinuses/Orbits: No acute orbital abnormality. Post lens replacement. Visualized paranasal sinuses are clear. Other: Mastoid air cells and middle ear cavities are clear. IMPRESSION: No acute intracranial abnormality. Chronic microvascular changes. Atrophy. Electronically Signed   By: Genia Del M.D.   On: 11/14/2016 13:57        Scheduled Meds: . famotidine  20 mg Oral Daily  . feeding supplement  1 Container Oral TID BM   Or  . feeding supplement (ENSURE ENLIVE)  237 mL Oral TID BM  . LORazepam  0.25 mg Intravenous Once  . OLANZapine zydis  5 mg Oral QHS  . sodium chloride flush  3 mL Intravenous Q12H   Continuous Infusions: . valproate sodium Stopped (11/16/16 0034)     LOS: 2 days    Time spent: 72 mins    Latecia Miler, MD [facility] Pager [phone]  If 7PM-7AM, please contact night-coverage [email or web address] Password [email or web address] 11/16/2016, 9:35 AM

## 2016-12-19 ENCOUNTER — Telehealth: Payer: Self-pay | Admitting: *Deleted

## 2016-12-19 NOTE — Telephone Encounter (Signed)
Please advise 

## 2016-12-19 NOTE — Telephone Encounter (Signed)
Patient went to [facility] and she with being followed by the hospice physician.    She is now back at [facility] and hospice is wanting to verify if Dr. Birdie Riddle will take back over as attending. Family has requested this.

## 2016-12-24 NOTE — Telephone Encounter (Signed)
Can Hospice continue to be the attending as they would provide the best continuity of care is this situation?

## 2016-12-25 NOTE — Telephone Encounter (Signed)
Pt daughter informed that allergy list was updated in the hospital. Pt daughter was also advised that Dr. Birdie Riddle has full confidence in the PA that treats pts at [facility]. Family was advised that Dr. Birdie Riddle would not continue care as PCP due to pt increased dementia symptoms and combativeness.

## 2016-12-25 NOTE — Telephone Encounter (Signed)
Daughter [name] states that pt is not at [facility] any more that pt is at [facility] and states that pt is not being seen by a doctor with Hospice, daughter is asking if Dr. Birdie Riddle will still continue to provide care. Also [name] is asking when did pt become allergic to seroquel, she is asking for a call back.

## 2016-12-25 NOTE — Telephone Encounter (Signed)
Called [facility] and advised that Dr. Birdie Riddle would prefer for that the [facility] attendee take over care.

## 2017-03-05 DIAGNOSIS — R269 Unspecified abnormalities of gait and mobility: Secondary | ICD-10-CM | POA: Diagnosis not present

## 2017-03-05 DIAGNOSIS — F809 Developmental disorder of speech and language, unspecified: Secondary | ICD-10-CM | POA: Diagnosis not present

## 2017-03-05 DIAGNOSIS — R1310 Dysphagia, unspecified: Secondary | ICD-10-CM | POA: Diagnosis not present

## 2017-03-06 DIAGNOSIS — Z79899 Other long term (current) drug therapy: Secondary | ICD-10-CM | POA: Diagnosis not present

## 2017-03-27 DIAGNOSIS — R110 Nausea: Secondary | ICD-10-CM | POA: Diagnosis not present

## 2017-03-27 DIAGNOSIS — R269 Unspecified abnormalities of gait and mobility: Secondary | ICD-10-CM | POA: Diagnosis not present

## 2017-03-27 DIAGNOSIS — K5901 Slow transit constipation: Secondary | ICD-10-CM | POA: Diagnosis not present

## 2017-04-12 DIAGNOSIS — Z23 Encounter for immunization: Secondary | ICD-10-CM | POA: Diagnosis not present

## 2017-04-19 DIAGNOSIS — B351 Tinea unguium: Secondary | ICD-10-CM | POA: Diagnosis not present

## 2017-04-19 DIAGNOSIS — I739 Peripheral vascular disease, unspecified: Secondary | ICD-10-CM | POA: Diagnosis not present

## 2017-04-19 DIAGNOSIS — R269 Unspecified abnormalities of gait and mobility: Secondary | ICD-10-CM | POA: Diagnosis not present

## 2017-04-23 DIAGNOSIS — M6281 Muscle weakness (generalized): Secondary | ICD-10-CM | POA: Diagnosis not present

## 2017-04-23 DIAGNOSIS — R1310 Dysphagia, unspecified: Secondary | ICD-10-CM | POA: Diagnosis not present

## 2017-04-23 DIAGNOSIS — M25512 Pain in left shoulder: Secondary | ICD-10-CM | POA: Diagnosis not present

## 2017-04-25 ENCOUNTER — Ambulatory Visit: Payer: Self-pay | Admitting: Neurology

## 2017-05-14 DIAGNOSIS — R269 Unspecified abnormalities of gait and mobility: Secondary | ICD-10-CM | POA: Diagnosis not present

## 2017-05-14 DIAGNOSIS — K5900 Constipation, unspecified: Secondary | ICD-10-CM | POA: Diagnosis not present

## 2017-05-14 DIAGNOSIS — S43402D Unspecified sprain of left shoulder joint, subsequent encounter: Secondary | ICD-10-CM | POA: Diagnosis not present

## 2017-05-21 DIAGNOSIS — E46 Unspecified protein-calorie malnutrition: Secondary | ICD-10-CM | POA: Diagnosis not present

## 2017-05-21 DIAGNOSIS — I498 Other specified cardiac arrhythmias: Secondary | ICD-10-CM | POA: Diagnosis not present

## 2017-05-26 DIAGNOSIS — R633 Feeding difficulties: Secondary | ICD-10-CM | POA: Diagnosis not present

## 2017-05-26 DIAGNOSIS — I498 Other specified cardiac arrhythmias: Secondary | ICD-10-CM | POA: Diagnosis not present

## 2017-05-26 DIAGNOSIS — G3184 Mild cognitive impairment, so stated: Secondary | ICD-10-CM | POA: Diagnosis not present

## 2017-06-26 DIAGNOSIS — B351 Tinea unguium: Secondary | ICD-10-CM | POA: Diagnosis not present

## 2017-06-26 DIAGNOSIS — I739 Peripheral vascular disease, unspecified: Secondary | ICD-10-CM | POA: Diagnosis not present

## 2017-07-16 DIAGNOSIS — R1915 Other abnormal bowel sounds: Secondary | ICD-10-CM | POA: Diagnosis not present

## 2017-07-16 DIAGNOSIS — K219 Gastro-esophageal reflux disease without esophagitis: Secondary | ICD-10-CM | POA: Diagnosis not present

## 2017-07-16 DIAGNOSIS — E46 Unspecified protein-calorie malnutrition: Secondary | ICD-10-CM | POA: Diagnosis not present

## 2017-07-20 DIAGNOSIS — R3915 Urgency of urination: Secondary | ICD-10-CM | POA: Diagnosis not present

## 2017-07-31 ENCOUNTER — Emergency Department (HOSPITAL_COMMUNITY)

## 2017-07-31 ENCOUNTER — Encounter (HOSPITAL_COMMUNITY): Payer: Self-pay | Admitting: Emergency Medicine

## 2017-07-31 ENCOUNTER — Emergency Department (HOSPITAL_COMMUNITY)
Admission: EM | Admit: 2017-07-31 | Discharge: 2017-08-01 | Disposition: A | Discharge status: Home / Self Care | Attending: Emergency Medicine | Admitting: Emergency Medicine

## 2017-07-31 ENCOUNTER — Other Ambulatory Visit: Payer: Self-pay

## 2017-07-31 DIAGNOSIS — F23 Brief psychotic disorder: Secondary | ICD-10-CM | POA: Insufficient documentation

## 2017-07-31 DIAGNOSIS — Z79899 Other long term (current) drug therapy: Secondary | ICD-10-CM | POA: Diagnosis not present

## 2017-07-31 DIAGNOSIS — R4689 Other symptoms and signs involving appearance and behavior: Secondary | ICD-10-CM | POA: Diagnosis present

## 2017-07-31 DIAGNOSIS — F0391 Unspecified dementia with behavioral disturbance: Secondary | ICD-10-CM | POA: Diagnosis not present

## 2017-07-31 DIAGNOSIS — I10 Essential (primary) hypertension: Secondary | ICD-10-CM | POA: Diagnosis not present

## 2017-07-31 DIAGNOSIS — F0281 Dementia in other diseases classified elsewhere with behavioral disturbance: Secondary | ICD-10-CM

## 2017-07-31 DIAGNOSIS — F02818 Dementia in other diseases classified elsewhere, unspecified severity, with other behavioral disturbance: Secondary | ICD-10-CM

## 2017-07-31 DIAGNOSIS — K449 Diaphragmatic hernia without obstruction or gangrene: Secondary | ICD-10-CM | POA: Diagnosis not present

## 2017-07-31 HISTORY — DX: Dysphagia, unspecified: R13.10

## 2017-07-31 LAB — VALPROIC ACID LEVEL: Valproic Acid Lvl: <10 ug/mL — ABNORMAL LOW (ref 50.0–100.0)

## 2017-07-31 LAB — COMPREHENSIVE METABOLIC PANEL
ALT: 11 U/L — ABNORMAL LOW (ref 14–54)
ANION GAP: 11 (ref 5–15)
AST: 22 U/L (ref 15–41)
Albumin: 3.3 g/dL — ABNORMAL LOW (ref 3.5–5.0)
Alkaline Phosphatase: 50 U/L (ref 38–126)
BUN: 23 mg/dL — ABNORMAL HIGH (ref 6–20)
CHLORIDE: 105 mmol/L (ref 101–111)
CO2: 25 mmol/L (ref 22–32)
Calcium: 9.8 mg/dL (ref 8.9–10.3)
Creatinine, Ser: 0.96 mg/dL (ref 0.44–1.00)
GFR calc Af Amer: 59 mL/min — ABNORMAL LOW (ref >60)
GFR calc non Af Amer: 51 mL/min — ABNORMAL LOW (ref >60)
Glucose, Bld: 108 mg/dL — ABNORMAL HIGH (ref 65–99)
POTASSIUM: 4.5 mmol/L (ref 3.5–5.1)
SODIUM: 141 mmol/L (ref 135–145)
TOTAL PROTEIN: 6.2 g/dL — AB (ref 6.5–8.1)
Total Bilirubin: 0.7 mg/dL (ref 0.3–1.2)

## 2017-07-31 LAB — CBC WITH DIFFERENTIAL/PLATELET
BASOS ABS: 0 10*3/uL (ref 0.0–0.1)
Basophils Relative: 0 %
EOS ABS: 0 10*3/uL (ref 0.0–0.7)
Eosinophils Relative: 0 %
HCT: 35.2 % — ABNORMAL LOW (ref 36.0–46.0)
Hemoglobin: 11.3 g/dL — ABNORMAL LOW (ref 12.0–15.0)
Lymphocytes Relative: 14 %
Lymphs Abs: 1.3 10*3/uL (ref 0.7–4.0)
MCH: 31.5 pg (ref 26.0–34.0)
MCHC: 32.1 g/dL (ref 30.0–36.0)
MCV: 98.1 fL (ref 78.0–100.0)
MONO ABS: 0.7 10*3/uL (ref 0.1–1.0)
MONOS PCT: 8 %
NEUTROS PCT: 78 %
Neutro Abs: 7 10*3/uL (ref 1.7–7.7)
Platelets: 257 10*3/uL (ref 150–400)
RBC: 3.59 MIL/uL — ABNORMAL LOW (ref 3.87–5.11)
RDW: 13.8 % (ref 11.5–15.5)
WBC: 9.1 10*3/uL (ref 4.0–10.5)

## 2017-07-31 LAB — URINALYSIS, ROUTINE W REFLEX MICROSCOPIC
Bilirubin Urine: NEGATIVE
GLUCOSE, UA: NEGATIVE mg/dL
Hgb urine dipstick: NEGATIVE
Ketones, ur: NEGATIVE mg/dL
LEUKOCYTES UA: NEGATIVE
Nitrite: NEGATIVE
PH: 7 (ref 5.0–8.0)
Protein, ur: NEGATIVE mg/dL
SPECIFIC GRAVITY, URINE: 1.013 (ref 1.005–1.030)

## 2017-07-31 LAB — I-STAT TROPONIN, ED: Troponin i, poc: 0.02 ng/mL (ref 0.00–0.08)

## 2017-07-31 MED ORDER — DICLOFENAC SODIUM 1 % TD GEL
4.0000 g | Freq: Two times a day (BID) | TRANSDERMAL | Status: DC
  Filled 2017-07-31: qty 100

## 2017-07-31 MED ORDER — ALUM & MAG HYDROXIDE-SIMETH 200-200-20 MG/5ML PO SUSP: 30.0000 mL | Freq: Four times a day (QID) | ORAL | Status: DC | PRN

## 2017-07-31 MED ORDER — FAMOTIDINE 20 MG PO TABS
20.0000 mg | ORAL_TABLET | Freq: Two times a day (BID) | ORAL | Status: DC
  Administered 2017-08-01: 20 mg via ORAL
  Filled 2017-07-31: qty 1

## 2017-07-31 MED ORDER — ESCITALOPRAM OXALATE 10 MG PO TABS
5.0000 mg | ORAL_TABLET | Freq: Every day | ORAL | Status: DC
  Administered 2017-08-01: 5 mg via ORAL
  Filled 2017-07-31: qty 1

## 2017-07-31 MED ORDER — ACETAMINOPHEN 325 MG PO TABS: 650.0000 mg | ORAL_TABLET | ORAL | Status: DC | PRN

## 2017-07-31 MED ORDER — ONDANSETRON HCL 4 MG PO TABS: 4.0000 mg | ORAL_TABLET | Freq: Three times a day (TID) | ORAL | Status: DC | PRN

## 2017-07-31 MED ORDER — SENNA 8.6 MG PO TABS: 1.0000 | ORAL_TABLET | Freq: Every day | ORAL | Status: DC | PRN

## 2017-07-31 MED ORDER — MORPHINE SULFATE (CONCENTRATE) 10 MG/0.5ML PO SOLN: 5.0000 mg | ORAL | Status: DC | PRN
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
## 2017-07-31 NOTE — ED Notes (Signed)
Patient currently in CT °

## 2017-07-31 NOTE — ED Notes (Signed)
Pt resting with family at bedside

## 2017-08-01 NOTE — Progress Notes (Addendum)
[facility] Liaison:  RN visit.  Met with patient and daughter, [name], at bedside.  Per bedside RN, Lala Lund, MD's are making rounds now.  Patient is alert and oriented to person, place, but not situation.  Patient states she does not remember why she had to come to the hospital.  Daughter, [name], advised that the doctors told her last night that they were considering placement to geri-psych facility for inpatient care.  Per [facility] assessment note, "PA [name] recommends the patient be admitted to a geriatric inpatient facility".  MD's making rounds this morning advised that they will need to get with psychiatry and come up with plan for patient.  Given that patient is hospice patient, they are taking that into consideration to make recommendations.    MD team rounding advised that the patient may just need medication adjustment, but still in planning/talking stages at this time.   I have talked with [facility] RN about the same.  She is relaying all information to [place], [place], who will be coming by the hospital for visit today.  Daughter, Lynelle Smoke, is currently at bedside with mother.   UPDATE:  I spoke with Dr. Mariea Clonts and she advised she is fine to go back to the facility today.  Hospital ED will arrange with SW.    We will continue to follow patient while hospitalized and anticipate any discharge needs.  Please feel free to contact [facility] with any questions or concerns.   Thank you,  Edyth Gunnels, RN, BSN [facility] Liaison [phone]  All hospital liaisons are now on [place].

## 2017-08-01 NOTE — ED Notes (Signed)
Daughter-[name] sanders [phone]

## 2017-08-01 NOTE — Progress Notes (Signed)
CSW attempted to contact [place] at [phone] to notify them of patients disposition- CSW with notified that administrator was in meeting and would have to return CSW's call.   Kingsley Spittle, [facility] Emergency Room Clinical Social Worker [phone]

## 2017-08-01 NOTE — BH Assessment (Addendum)
Assessment Note  Kara Burnett is an 84 y.o. female. The pt came in after having thoughts that other residents at her memory care facility, [facility], were trying to do something to her.  She picked up a vase with a thought to hit the other residents.  She then decided against this and used the vase to break a window, so she could escape.  The pt still thinks the other residents are out to get her.  According to the pt's daughter,Kara Burnett, the pt  Has not had any mental health treatment in the past.  She was recently started on Haldol.  The pt's daughter reported there is not a history of SA or legal issues  The pt is not oriented.  She stated she could not remember the year and the current president is Kara Burnett.  She was able to identify her daughter in the room.  The pt has a difficult time hearing without her hearing aids.  When asked questions the pt often stated she did not want to talk about what happened earlier today. The pt has been cooperative while in the ER according to ER staff.  The pt denies SI.  Diagnosis: F23 Brief psychotic disorder   Past Medical History:  Past Medical History:  Diagnosis Date  . Anemia   . Anemia   . Arthritis   . Dementia   . Dysphagia   . GERD (gastroesophageal reflux disease)   . Herpes zoster   . Hyperlipidemia   . Hypertension   . Osteoporosis   . Retinal vein occlusion 2015   Dr.Sanders    Past Surgical History:  Procedure Laterality Date  . Cataract surgery     Bilaterally  . CHOLECYSTECTOMY  1990  . COLONOSCOPY  2008   negative  . sacroplasty     Burnett pelvic fracture    Family History:  Family History  Problem Relation Age of Onset  . Stroke Father   . Heart disease Father        MI in 25s  . Heart disease Sister        MI in 62s  . Diabetes Sister   . Stroke Mother        coma  . Hypothyroidism Son   . Heart disease Son     Social History:  reports that  has never smoked. she has never used  smokeless tobacco. She reports that she does not drink alcohol or use drugs.  Additional Social History:  Alcohol / Drug Use Pain Medications: See MAR Prescriptions: See MAR Over the Counter: See MAR History of alcohol / drug use?: No history of alcohol / drug abuse Longest period of sobriety (when/how long): NA  CIWA: CIWA-Ar BP: (!) 99/55 Pulse Rate: 82 COWS:    Allergies:  Allergies  Allergen Reactions  . Seroquel [Quetiapine] Other (See Comments)    Delirium  . Ciprofloxacin Nausea And Vomiting    Home Medications:  (Not in a hospital admission)  OB/GYN Status:  No LMP recorded (lmp unknown). Patient is postmenopausal.  General Assessment Data Location of Assessment: [facility] ED TTS Assessment: In system Is this a Tele or Face-to-Face Assessment?: Face-to-Face Is this an Initial Assessment or a Re-assessment for this encounter?: Initial Assessment Marital status: Widowed [place] name: unable to assess Is patient pregnant?: No Pregnancy Status: No Living Arrangements: Other (Comment)(Assisted living facility) Can pt return to current living arrangement?: Yes Admission Status: Voluntary Is patient capable of signing voluntary admission?: Yes Referral  Source: Other(assisted living facility) Insurance type: Medicare     Crisis Care Plan Living Arrangements: Other (Comment)(Assisted living facility) Legal Guardian: Other:(self) Name of Psychiatrist: none Name of Therapist: none  Education Status Is patient currently in school?: No Current Grade: unable to assess Highest grade of school patient has completed: NA Name of school: NA Contact person: NA  Risk to self with the past 6 months Suicidal Ideation: No Has patient been a risk to self within the past 6 months prior to admission? : No Suicidal Intent: No Has patient had any suicidal intent within the past 6 months prior to admission? : No Is patient at risk for suicide?: No Suicidal Plan?: No Has patient had  any suicidal plan within the past 6 months prior to admission? : No Access to Means: No What has been your use of drugs/alcohol within the last 12 months?: none Previous Attempts/Gestures: No How many times?: 0 Other Self Harm Risks: none Triggers for Past Attempts: None known Intentional Self Injurious Behavior: None Family Suicide History: No Recent stressful life event(s): Recent negative physical changes(dementia) Persecutory voices/beliefs?: No Depression: No Depression Symptoms: Feeling angry/irritable Substance abuse history and/or treatment for substance abuse?: No Suicide prevention information given to non-admitted patients: Not applicable  Risk to Others within the past 6 months Homicidal Ideation: No Does patient have any lifetime risk of violence toward others beyond the six months prior to admission? : Yes (comment)(wanted to hit other residents today) Thoughts of Harm to Others: Yes-Currently Present Comment - Thoughts of Harm to Others: thinks people are out to get her and she was going to hit them Current Homicidal Intent: No Current Homicidal Plan: No Access to Homicidal Means: No Identified Victim: NA History of harm to others?: No Assessment of Violence: On admission Violent Behavior Description: threw vase at window and broke it. Does patient have access to weapons?: No Criminal Charges Pending?: No Does patient have a court date: No Is patient on probation?: No  Psychosis Hallucinations: None noted Delusions: Persecutory  Mental Status Report Appearance/Hygiene: In hospital gown, Unremarkable Eye Contact: Fair Motor Activity: Unable to assess Speech: Unremarkable Level of Consciousness: Alert, Irritable Mood: Irritable Affect: Irritable Anxiety Level: None Thought Processes: Relevant, Coherent Judgement: Impaired Orientation: Person, Situation Obsessive Compulsive Thoughts/Behaviors: None  Cognitive Functioning Concentration:  Decreased Memory: Recent Impaired, Remote Impaired IQ: Average Insight: Poor Impulse Control: Poor Appetite: Good Weight Loss: 0 Weight Gain: 0 Sleep: No Change Total Hours of Sleep: 8 Vegetative Symptoms: None  ADLScreening [facility] Assessment Services) Patient's cognitive ability adequate to safely complete daily activities?: Yes Patient able to express need for assistance with ADLs?: Yes Independently performs ADLs?: Yes (appropriate for developmental age)  Prior Inpatient Therapy Prior Inpatient Therapy: No Prior Therapy Dates: NA Prior Therapy Facilty/Provider(s): NA Reason for Treatment: NA  Prior Outpatient Therapy Prior Outpatient Therapy: No Prior Therapy Dates: NA Prior Therapy Facilty/Provider(s): NA Reason for Treatment: NA Does patient have an ACCT team?: No Does patient have Intensive In-House Services?  : No Does patient have [facility] services? : No Does patient have P4CC services?: No  ADL Screening (condition at time of admission) Patient's cognitive ability adequate to safely complete daily activities?: Yes Patient able to express need for assistance with ADLs?: Yes Independently performs ADLs?: Yes (appropriate for developmental age)       Abuse/Neglect Assessment (Assessment to be complete while patient is alone) Abuse/Neglect Assessment Can Be Completed: Unable to assess, patient is non-responsive or altered mental status Values / Beliefs Cultural  Requests During Hospitalization: None Spiritual Requests During Hospitalization: None Consults Spiritual Care Consult Needed: No Social Work Consult Needed: No Regulatory affairs officer (For Healthcare) Does Patient Have a Medical Advance Directive?: Yes Does patient want to make changes to medical advance directive?: No - Patient declined Type of Advance Directive: Out of facility DNR (pink MOST or yellow form) Pre-existing out of facility DNR order (yellow form or pink MOST form): Yellow form placed in chart  (order not valid for inpatient use)    Additional Information 1:1 In Past 12 Months?: No CIRT Risk: No Elopement Risk: No Does patient have medical clearance?: Yes     Disposition:  Disposition Initial Assessment Completed for this Encounter: Yes Disposition of Patient: Inpatient treatment program Type of inpatient treatment program: Adult(geriatric)   PA [name] recommends the pt be admitted to a geriatric inpatient facility. RN Mortimer Fries made aware of the recommendations.   On Site Evaluation by:   Reviewed with Physician:    Enzo Montgomery 08/01/2017 12:35 AM

## 2017-08-01 NOTE — BHH Suicide Risk Assessment (Signed)
Suicide Risk Assessment  Discharge Assessment   [facility] Discharge Suicide Risk Assessment   Principal Problem: <principal problem not specified> Discharge Diagnoses:  Patient Active Problem List   Diagnosis Date Noted  . Involuntary commitment [Z04.6]     Priority: High  . Dementia [F03.90] 06/15/2015    Priority: High  . Hypokalemia [E87.6] 11/15/2016  . Acute metabolic encephalopathy [C37.62] 11/15/2016  . Hypomagnesemia [E83.42] 11/15/2016  . Lethargy [R53.83]   . Palliative care encounter [Z51.5]   . Goals of care, counseling/discussion [Z71.89]   . BRBPR (bright red blood per rectum) [K62.5] 11/12/2016  . Bradycardia [R00.1] 11/12/2016  . Homicidal ideation [R45.850]   . Protein-calorie malnutrition, severe ([place]) [E43] 10/13/2015  . Edema of right foot [R60.0] 10/13/2015  . Physical exam [Z00.00] 04/14/2015  . Paranoia ([place]) [F22] 04/14/2015  . Acute bronchitis [J20.9] 04/18/2014  . Absence of bladder continence [R32] 04/18/2014  . Atypical nevus of neck [D22.4] 10/01/2013  . Acute upper respiratory infections of unspecified site [J06.9] 10/16/2012  . Bilateral conjunctivitis [H10.9] 10/16/2012  . Chest rales [R09.89] 10/16/2012  . Allergic rhinitis, cause unspecified [J30.9] 10/16/2012  . Hypercalcemia [E83.52] 04/10/2012  . Vitamin D deficiency [E55.9] 05/09/2010  . ANEMIA, MILD [D64.9] 05/09/2010  . RESTLESS LEG SYNDROME [G25.81] 05/09/2010  . FATIGUE [R53.81, R53.83] 11/16/2009  . POSTURAL LIGHTHEADEDNESS [R42] 02/01/2009  . Other specified disease of white blood cells [D72.89] 10/01/2008  . Depression [F32.9] 03/26/2008  . Pernicious anemia [D51.0] 07/05/2007  . CERVICAL RADICULOPATHY [M54.12] 07/05/2007  . Hyperlipidemia [E78.5] 04/06/2007  . Essential hypertension [I10] 04/06/2007  . GERD [K21.9] 04/06/2007  . Osteoporosis [M81.0] 04/06/2007   Pt was seen and chart reviewed with treatment team and Dr Mariea Clonts.  Pt denies suicidal/homicidal ideation, denies  auditory/visual hallucinations and does not appear to be responding to internal stimuli. Pt lives in a memory care unit and recently has become combative and confused. Pt was recently started on Ativan and since has been acting very confused and delusional. Pt will be advised to stop taking ativan nad return to her facility where she lives and feels safe. Pt is psychiatrically clear for discharge.   Total Time spent with patient: 30 minutes  Musculoskeletal: Strength & Muscle Tone: within normal limits Gait & Station: normal Patient leans: N/A  Psychiatric Specialty Exam:   Blood pressure (!) 99/55, pulse 82, temperature 98.6 F (37 C), temperature source Oral, resp. rate 18, SpO2 96 %.There is no height or weight on file to calculate BMI.  General Appearance: Casual  Eye Contact::  Good  Speech:  Clear and [id number]  Volume:  Normal  Mood:  Depressed  Affect:  Congruent and Depressed  Thought Process:  Coherent  Orientation:  Other:  person and place  Thought Content:  Delusions  Suicidal Thoughts:  No  Homicidal Thoughts:  No  Memory:  Immediate;   Poor Recent;   Poor Remote;   Poor  Judgement:  Poor  Insight:  Lacking  Psychomotor Activity:  Normal  Concentration:  Fair  Recall:  [place] of Knowledge:Fair  Language: Good  Akathisia:  No  Handed:  Right  AIMS (if indicated):     Assets:  Agricultural consultant Housing Social Support  Sleep:     Cognition: WNL  ADL's:  Intact   Mental Status Per Nursing Assessment::   On Admission:     Demographic Factors:  Age 84 or older, Divorced or widowed and Caucasian  Loss Factors: Decline in physical health  Historical  Factors: Impulsivity  Risk Reduction Factors:   Sense of responsibility to family and Positive social support  Continued Clinical Symptoms:  Depression:   Impulsivity  Cognitive Features That Contribute To Risk:  Loss of executive function    Suicide Risk:   Minimal: No identifiable suicidal ideation.  Patients presenting with no risk factors but with morbid ruminations; may be classified as minimal risk based on the severity of the depressive symptoms    Plan Of Care/Follow-up recommendations:  Activity:  as tolerated Diet:  Heart healthy  Ethelene Hal, NP 08/01/2017, 12:01 PM

## 2017-08-01 NOTE — Progress Notes (Addendum)
[facility] Liaison:  RN   Spoke with Junie Panning, [place], to request medication recommendations in with discharge instructions to go back to the facility with the patient.  I have talked with the patient's [facility] team and they are requesting same.   UPDATE: I spoke with Alma Civil engineer, contracting at [employer]) - she advised there was no problem with patient coming back to facility.  I updated Junie Panning, CSW, of same.  As patient is a current [facility] patient, please use [facility] for transport back to the facility.  RN report should be called back to [employer] directly.   Thank you,  Edyth Gunnels, RN, BSN [facility] Liaison [phone]  All hospital liaisons are now on [place].

## 2017-08-01 NOTE — BH Assessment (Signed)
[facility] Assessment Progress Note  Per Buford Dresser, DO, this pt does not require psychiatric hospitalization at this time.  Pt is to be discharged from [facility] to return to [facility].  Kingsley Spittle, LCSW agrees to facilitate this.  Pt's nurse, Lala Lund, has been notified.  Jalene Mullet, [place] Triage Specialist [phone]

## 2017-08-01 NOTE — Progress Notes (Signed)
CSW confirmed with Alma with [place] patient I able to return today. Please call report to [phone]. Patient will need to be transported via [facility].   Kingsley Spittle, [facility] Emergency Room Clinical Social Worker [phone]

## 2017-08-13 DIAGNOSIS — Z79899 Other long term (current) drug therapy: Secondary | ICD-10-CM | POA: Diagnosis not present

## 2018-01-17 DEATH — deceased

## 2018-05-14 IMAGING — DX DG CHEST 2V
2 series · 2 of 2 positions shown · non-contrast
Comparison: Chest radiograph May 21, 2016 and chest CT May 21, 2016

CLINICAL DATA: Altered mental status.  Hypertension.

EXAM:
CHEST  2 VIEW

[chest lat]
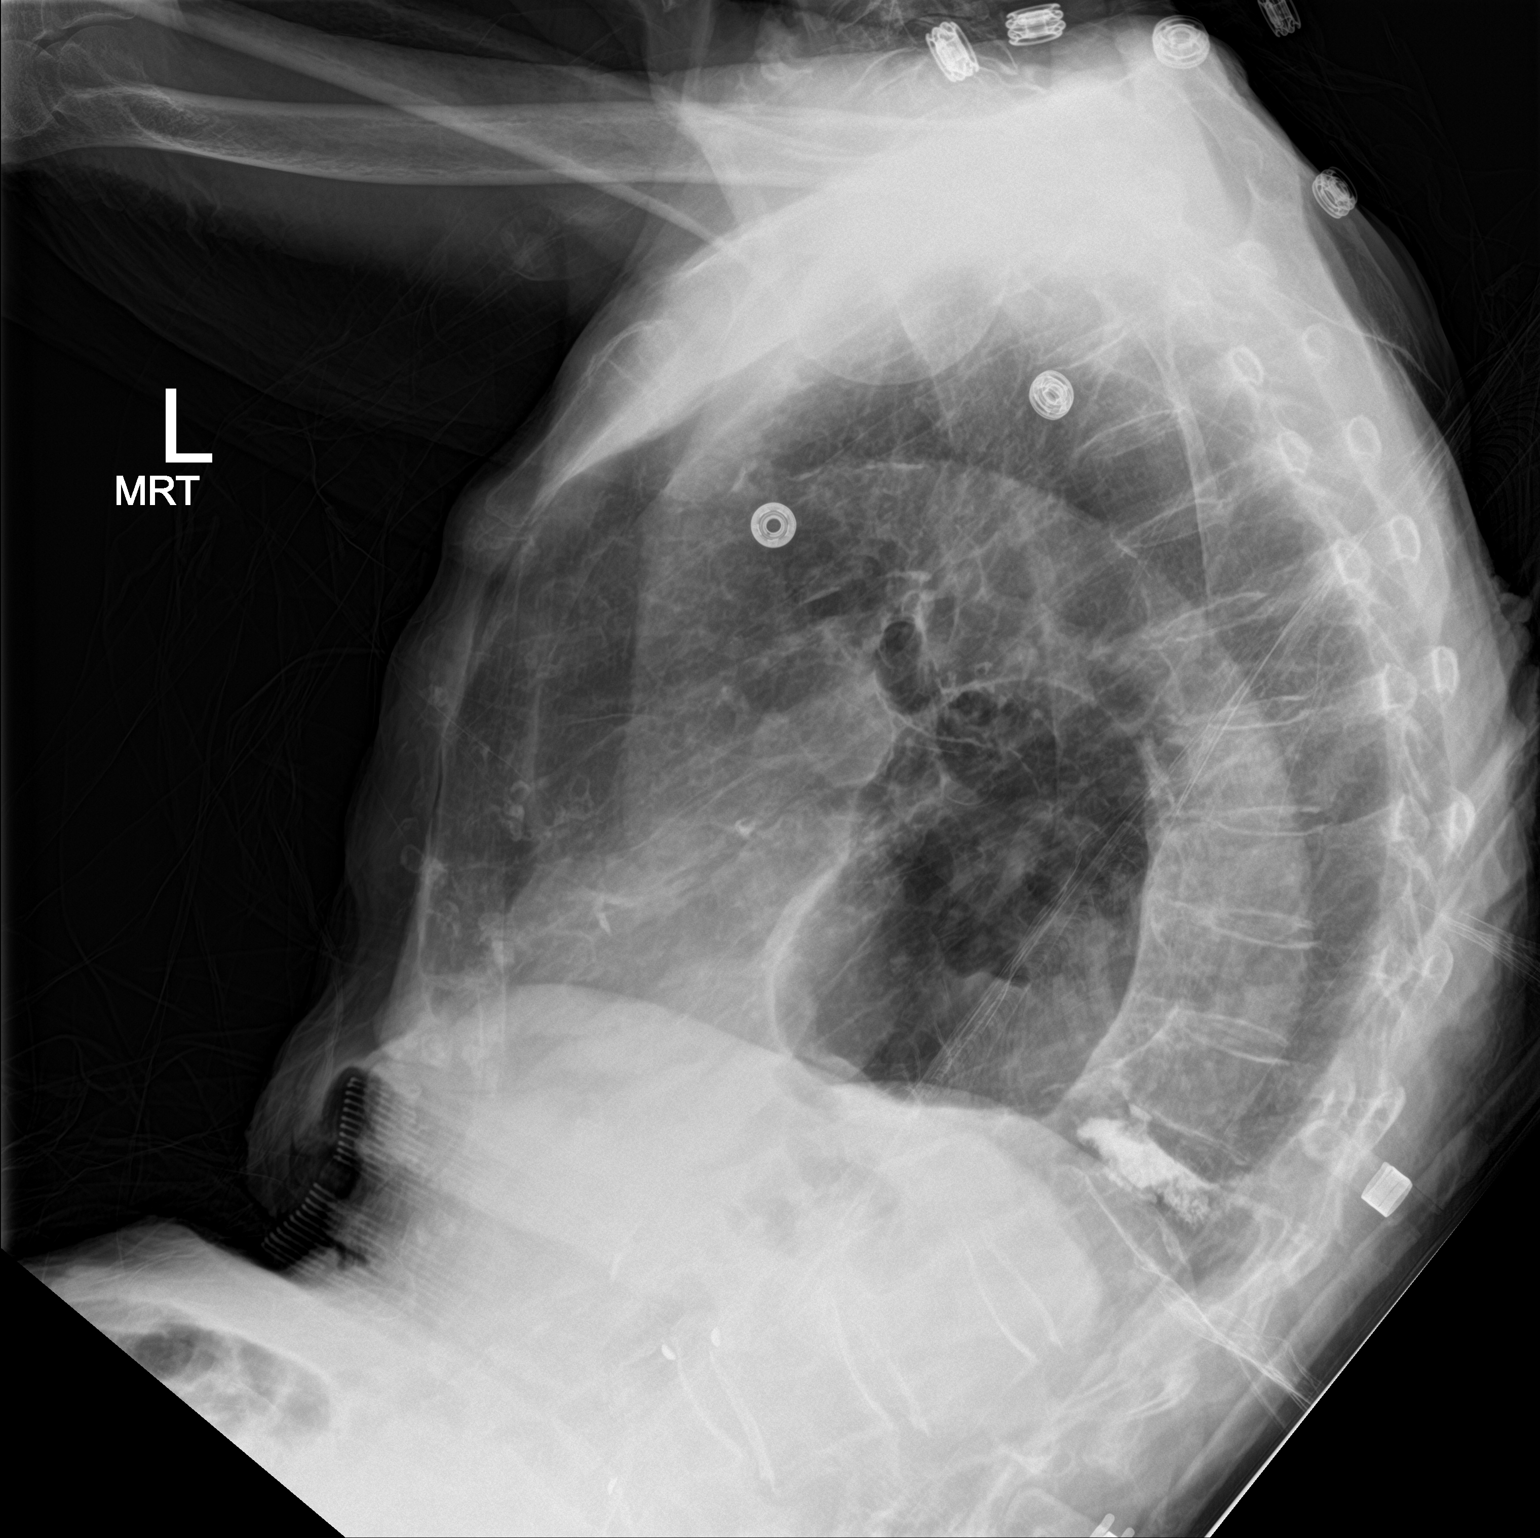

[chest ap]
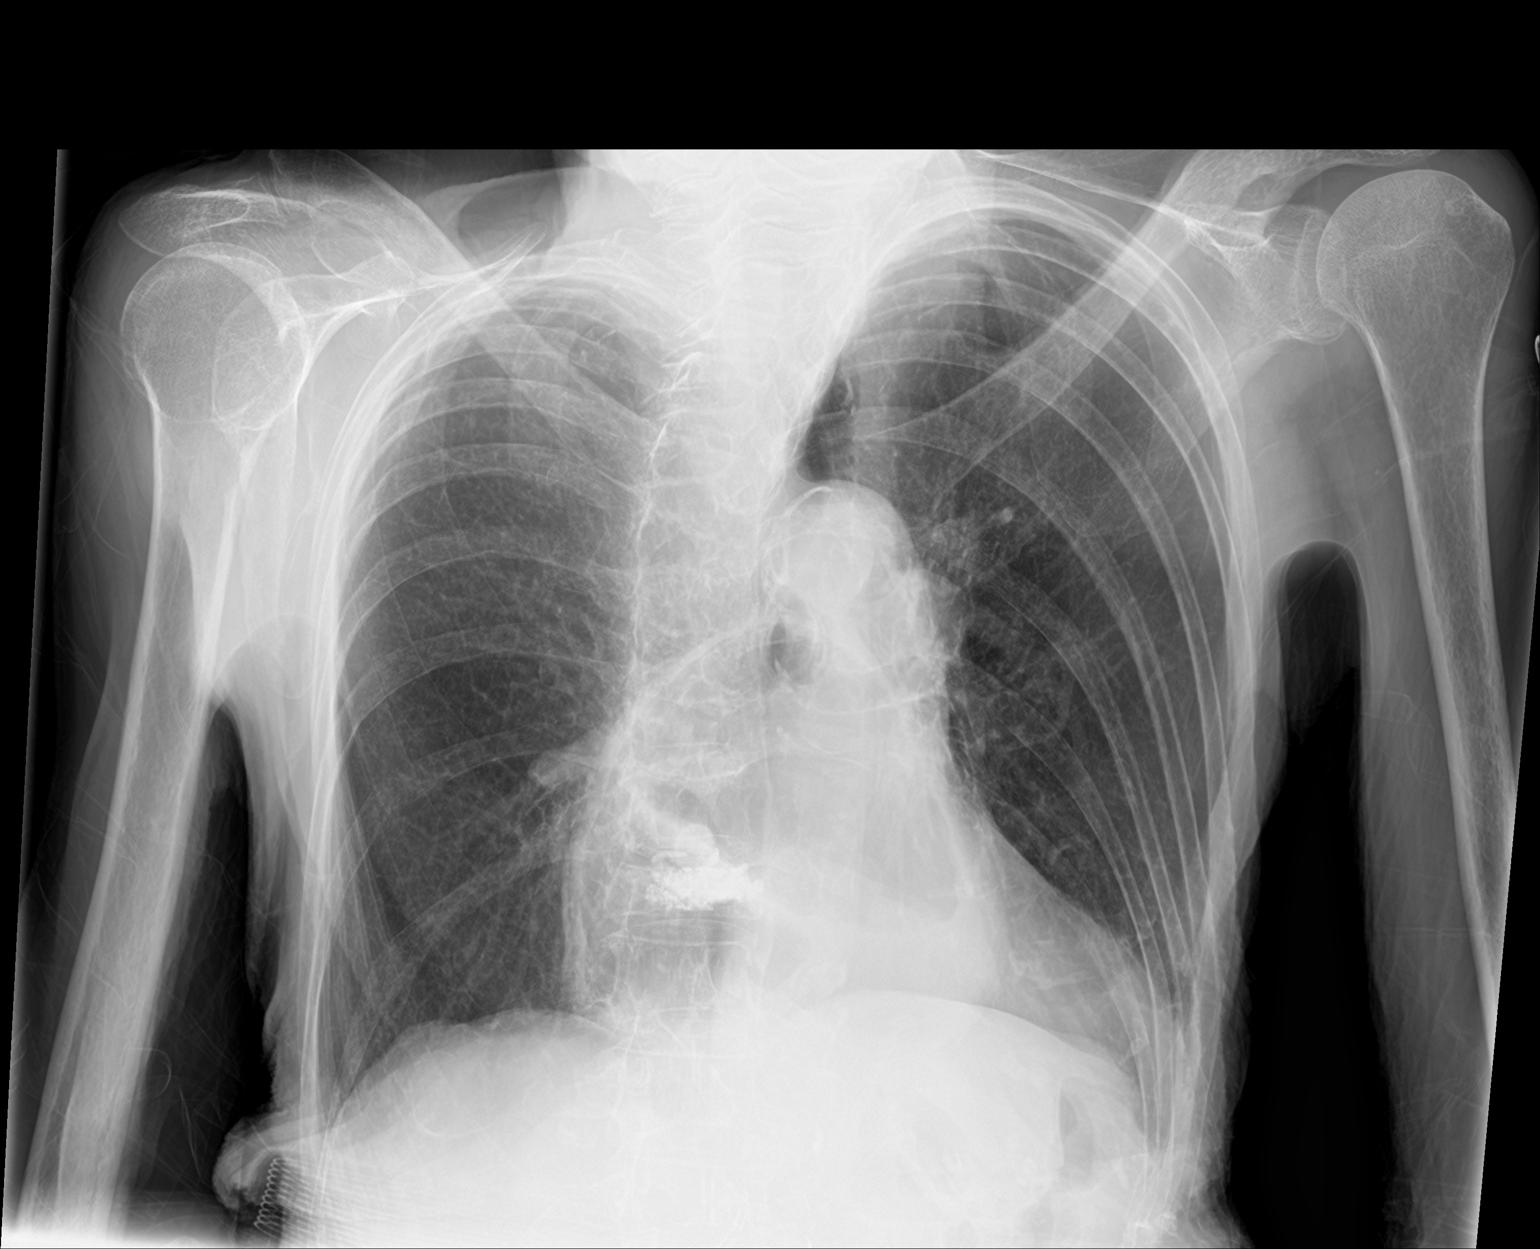

[2 of 2 positions shown; findings below may reference images not displayed]

FINDINGS: There is no edema or consolidation. Heart size and pulmonary
vascularity are normal. No adenopathy. There is a sizable hiatal
type hernia. There is aortic atherosclerosis. There is marked
wedging of a lower thoracic vertebral body which is undergone
kyphoplasty procedure in the past. Bones appear overall
osteoporotic.
IMPRESSION: No edema or consolidation. Aortic atherosclerosis. Large hiatal type
hernia.
